# Patient Record
Sex: Female | Born: 1990 | Race: Black or African American | Hispanic: No | Marital: Single | Smoking: Current some day smoker
Health system: Southern US, Community
[De-identification: ages and names within clinical notes are randomized; demographics above are authoritative.]

---
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
## 2010-07-26 ENCOUNTER — Ambulatory Visit: Payer: Self-pay

## 2010-08-07 ENCOUNTER — Ambulatory Visit: Payer: Self-pay

## 2010-08-11 ENCOUNTER — Inpatient Hospital Stay: Payer: Self-pay | Admitting: Obstetrics and Gynecology

## 2010-09-25 ENCOUNTER — Emergency Department: Payer: Self-pay | Admitting: Emergency Medicine

## 2010-12-08 ENCOUNTER — Emergency Department: Payer: Self-pay | Admitting: Internal Medicine

## 2012-06-07 ENCOUNTER — Inpatient Hospital Stay: Payer: Self-pay

## 2012-06-07 LAB — CBC WITH DIFFERENTIAL/PLATELET
Basophil #: 0 10*3/uL (ref 0.0–0.1)
Eosinophil #: 0.1 10*3/uL (ref 0.0–0.7)
Eosinophil %: 0.8 %
HCT: 35.2 % (ref 35.0–47.0)
HGB: 11.8 g/dL — ABNORMAL LOW (ref 12.0–16.0)
Lymphocyte #: 2.2 10*3/uL (ref 1.0–3.6)
MCH: 28.5 pg (ref 26.0–34.0)
MCHC: 33.6 g/dL (ref 32.0–36.0)
Neutrophil #: 7.4 10*3/uL — ABNORMAL HIGH (ref 1.4–6.5)
RBC: 4.13 10*6/uL (ref 3.80–5.20)
RDW: 14.6 % — ABNORMAL HIGH (ref 11.5–14.5)
WBC: 10.4 10*3/uL (ref 3.6–11.0)

## 2012-06-09 LAB — HEMATOCRIT: HCT: 31.3 % — ABNORMAL LOW (ref 35.0–47.0)

## 2012-11-27 ENCOUNTER — Emergency Department: Payer: Self-pay | Admitting: Internal Medicine

## 2012-11-27 LAB — URINALYSIS, COMPLETE
Bilirubin,UR: NEGATIVE
Glucose,UR: NEGATIVE mg/dL (ref 0–75)
Ketone: NEGATIVE
RBC,UR: 2 /HPF (ref 0–5)
Specific Gravity: 1.021 (ref 1.003–1.030)
Squamous Epithelial: 8

## 2012-11-27 LAB — COMPREHENSIVE METABOLIC PANEL
BUN: 15 mg/dL (ref 7–18)
Bilirubin,Total: 1.2 mg/dL — ABNORMAL HIGH (ref 0.2–1.0)
Calcium, Total: 8.8 mg/dL (ref 8.5–10.1)
Co2: 23 mmol/L (ref 21–32)
Creatinine: 0.64 mg/dL (ref 0.60–1.30)
Glucose: 93 mg/dL (ref 65–99)
Osmolality: 272 (ref 275–301)
Potassium: 4.4 mmol/L (ref 3.5–5.1)
SGPT (ALT): 35 U/L (ref 12–78)
Sodium: 136 mmol/L (ref 136–145)
Total Protein: 8.1 g/dL (ref 6.4–8.2)

## 2012-11-27 LAB — PROTIME-INR: INR: 1

## 2012-11-27 LAB — CBC
HGB: 12.9 g/dL (ref 12.0–16.0)
MCH: 28.1 pg (ref 26.0–34.0)
MCHC: 33.3 g/dL (ref 32.0–36.0)
Platelet: 279 10*3/uL (ref 150–440)
RBC: 4.61 10*6/uL (ref 3.80–5.20)
RDW: 13.8 % (ref 11.5–14.5)
WBC: 8.2 10*3/uL (ref 3.6–11.0)

## 2012-12-02 LAB — CULTURE, BLOOD (SINGLE)

## 2013-07-12 ENCOUNTER — Emergency Department: Payer: Self-pay | Admitting: Emergency Medicine

## 2013-07-12 LAB — BASIC METABOLIC PANEL
ANION GAP: 18 — AB (ref 7–16)
BUN: 7 mg/dL (ref 7–18)
Calcium, Total: 8.7 mg/dL (ref 8.5–10.1)
Chloride: 106 mmol/L (ref 98–107)
Co2: 23 mmol/L (ref 21–32)
Creatinine: 0.69 mg/dL (ref 0.60–1.30)
EGFR (African American): >60
EGFR (Non-African Amer.): >60
Glucose: 81 mg/dL (ref 65–99)
OSMOLALITY: 289 (ref 275–301)
POTASSIUM: 3.4 mmol/L — AB (ref 3.5–5.1)
Sodium: 147 mmol/L — ABNORMAL HIGH (ref 136–145)

## 2013-07-12 LAB — CBC
HCT: 39.7 % (ref 35.0–47.0)
HGB: 12.9 g/dL (ref 12.0–16.0)
MCH: 27.6 pg (ref 26.0–34.0)
MCHC: 32.4 g/dL (ref 32.0–36.0)
MCV: 85 fL (ref 80–100)
PLATELETS: 287 10*3/uL (ref 150–440)
RBC: 4.67 10*6/uL (ref 3.80–5.20)
RDW: 13.6 % (ref 11.5–14.5)
WBC: 5.4 10*3/uL (ref 3.6–11.0)

## 2013-07-12 LAB — HCG, QUANTITATIVE, PREGNANCY: BETA HCG, QUANT.: 6335 m[IU]/mL — AB

## 2013-07-12 LAB — URINALYSIS, COMPLETE
BACTERIA: NONE SEEN
Bilirubin,UR: NEGATIVE
GLUCOSE, UR: NEGATIVE mg/dL (ref 0–75)
Ketone: NEGATIVE
Leukocyte Esterase: NEGATIVE
NITRITE: NEGATIVE
Ph: 6 (ref 4.5–8.0)
Protein: 100
RBC,UR: 5941 /HPF (ref 0–5)
Specific Gravity: 1.017 (ref 1.003–1.030)
Squamous Epithelial: 3
WBC UR: 1 /HPF (ref 0–5)

## 2014-01-31 ENCOUNTER — Observation Stay: Payer: Self-pay | Admitting: Obstetrics and Gynecology

## 2014-01-31 LAB — URINALYSIS, COMPLETE
BILIRUBIN, UR: NEGATIVE
BLOOD: NEGATIVE
Glucose,UR: NEGATIVE mg/dL (ref 0–75)
Ketone: NEGATIVE
NITRITE: NEGATIVE
Ph: 5 (ref 4.5–8.0)
Protein: NEGATIVE
Specific Gravity: 1.023 (ref 1.003–1.030)
Squamous Epithelial: 4
WBC UR: 2 /HPF (ref 0–5)

## 2014-01-31 LAB — CBC WITH DIFFERENTIAL/PLATELET
BASOS ABS: 0.1 10*3/uL (ref 0.0–0.1)
Basophil %: 0.8 %
EOS ABS: 0.2 10*3/uL (ref 0.0–0.7)
Eosinophil %: 2.2 %
HCT: 33.7 % — ABNORMAL LOW (ref 35.0–47.0)
HGB: 11.1 g/dL — ABNORMAL LOW (ref 12.0–16.0)
LYMPHS ABS: 2.2 10*3/uL (ref 1.0–3.6)
LYMPHS PCT: 28.9 %
MCH: 28.2 pg (ref 26.0–34.0)
MCHC: 32.8 g/dL (ref 32.0–36.0)
MCV: 86 fL (ref 80–100)
MONO ABS: 0.4 x10 3/mm (ref 0.2–0.9)
Monocyte %: 5.8 %
Neutrophil #: 4.7 10*3/uL (ref 1.4–6.5)
Neutrophil %: 62.3 %
Platelet: 237 10*3/uL (ref 150–440)
RBC: 3.92 10*6/uL (ref 3.80–5.20)
RDW: 14 % (ref 11.5–14.5)
WBC: 7.5 10*3/uL (ref 3.6–11.0)

## 2014-01-31 LAB — COMPREHENSIVE METABOLIC PANEL
ALBUMIN: 3 g/dL — AB (ref 3.4–5.0)
ALK PHOS: 56 U/L
ANION GAP: 8 (ref 7–16)
BUN: 5 mg/dL — AB (ref 7–18)
Bilirubin,Total: 0.4 mg/dL (ref 0.2–1.0)
Calcium, Total: 8.3 mg/dL — ABNORMAL LOW (ref 8.5–10.1)
Chloride: 106 mmol/L (ref 98–107)
Co2: 24 mmol/L (ref 21–32)
Creatinine: 0.54 mg/dL — ABNORMAL LOW (ref 0.60–1.30)
EGFR (African American): >60
Glucose: 139 mg/dL — ABNORMAL HIGH (ref 65–99)
Osmolality: 275 (ref 275–301)
Potassium: 3.5 mmol/L (ref 3.5–5.1)
SGOT(AST): 11 U/L — ABNORMAL LOW (ref 15–37)
SGPT (ALT): 14 U/L
Sodium: 138 mmol/L (ref 136–145)
TOTAL PROTEIN: 6.6 g/dL (ref 6.4–8.2)

## 2014-01-31 LAB — DRUG SCREEN, URINE

## 2014-01-31 LAB — LIPASE, BLOOD: LIPASE: 77 U/L (ref 73–393)

## 2014-02-19 ENCOUNTER — Ambulatory Visit: Payer: Self-pay | Admitting: Advanced Practice Midwife

## 2014-05-20 ENCOUNTER — Observation Stay: Payer: Self-pay | Admitting: Obstetrics and Gynecology

## 2014-05-20 LAB — URINALYSIS, COMPLETE
Bilirubin,UR: NEGATIVE
Blood: NEGATIVE
Glucose,UR: NEGATIVE mg/dL (ref 0–75)
Ketone: NEGATIVE
LEUKOCYTE ESTERASE: NEGATIVE
Nitrite: NEGATIVE
PH: 6 (ref 4.5–8.0)
Protein: 30
RBC,UR: <1 /HPF (ref 0–5)
Specific Gravity: 1.021 (ref 1.003–1.030)
WBC UR: 2 /HPF (ref 0–5)

## 2014-05-21 LAB — RUPTURE OF MEMBRANE PLUS: Rom Plus: NOT DETECTED

## 2014-06-02 ENCOUNTER — Inpatient Hospital Stay: Payer: Medicaid Other | Admitting: Obstetrics and Gynecology

## 2014-06-02 LAB — CBC WITH DIFFERENTIAL/PLATELET
Basophil #: 0 10*3/uL (ref 0.0–0.1)
Basophil %: 0.2 %
Eosinophil #: 0 10*3/uL (ref 0.0–0.7)
Eosinophil %: 0.6 %
HCT: 34.5 % — AB (ref 35.0–47.0)
HGB: 11.3 g/dL — ABNORMAL LOW (ref 12.0–16.0)
Lymphocyte #: 1.9 10*3/uL (ref 1.0–3.6)
Lymphocyte %: 22.2 %
MCH: 27.5 pg (ref 26.0–34.0)
MCHC: 32.7 g/dL (ref 32.0–36.0)
MCV: 84 fL (ref 80–100)
MONO ABS: 0.7 x10 3/mm (ref 0.2–0.9)
MONOS PCT: 8.6 %
NEUTROS ABS: 5.9 10*3/uL (ref 1.4–6.5)
Neutrophil %: 68.4 %
Platelet: 228 10*3/uL (ref 150–440)
RBC: 4.09 10*6/uL (ref 3.80–5.20)
RDW: 16.8 % — AB (ref 11.5–14.5)
WBC: 8.6 10*3/uL (ref 3.6–11.0)

## 2014-06-03 LAB — HEMATOCRIT: HCT: 32.3 % — ABNORMAL LOW (ref 35.0–47.0)

## 2014-06-03 LAB — HEMOGLOBIN: HGB: 10.6 g/dL — ABNORMAL LOW (ref 12.0–16.0)

## 2014-06-03 LAB — GC/CHLAMYDIA PROBE AMP

## 2014-08-07 ENCOUNTER — Emergency Department
Admit: 2014-08-07 | Disposition: A | Discharge status: Home / Self Care | Payer: Self-pay | Admitting: Emergency Medicine

## 2014-08-07 LAB — COMPREHENSIVE METABOLIC PANEL
ALK PHOS: 64 U/L
ANION GAP: 6 — AB (ref 7–16)
AST: 18 U/L
Albumin: 4.4 g/dL
BUN: 10 mg/dL
Bilirubin,Total: 0.8 mg/dL
CALCIUM: 9.3 mg/dL
CHLORIDE: 110 mmol/L
Co2: 23 mmol/L
Creatinine: 0.67 mg/dL
EGFR (Non-African Amer.): >60
Glucose: 98 mg/dL
POTASSIUM: 3.7 mmol/L
SGPT (ALT): 18 U/L
Sodium: 139 mmol/L
Total Protein: 7.8 g/dL

## 2014-08-07 LAB — URINALYSIS, COMPLETE
BILIRUBIN, UR: NEGATIVE
Glucose,UR: NEGATIVE mg/dL (ref 0–75)
KETONE: NEGATIVE
LEUKOCYTE ESTERASE: NEGATIVE
Nitrite: NEGATIVE
PH: 5 (ref 4.5–8.0)
Protein: NEGATIVE
RBC,UR: 13 /HPF (ref 0–5)
SPECIFIC GRAVITY: 1.014 (ref 1.003–1.030)
WBC UR: 3 /HPF (ref 0–5)

## 2014-08-07 LAB — CBC
HCT: 38.3 % (ref 35.0–47.0)
HGB: 12.4 g/dL (ref 12.0–16.0)
MCH: 27.3 pg (ref 26.0–34.0)
MCHC: 32.2 g/dL (ref 32.0–36.0)
MCV: 85 fL (ref 80–100)
Platelet: 303 10*3/uL (ref 150–440)
RBC: 4.53 10*6/uL (ref 3.80–5.20)
RDW: 14.5 % (ref 11.5–14.5)
WBC: 6.1 10*3/uL (ref 3.6–11.0)

## 2014-08-07 LAB — WET PREP, GENITAL

## 2014-08-07 LAB — GC/CHLAMYDIA PROBE AMP

## 2014-09-15 NOTE — H&P (Signed)
L&D Evaluation:  History:  HPI 24 yo [id number] at 5553w0d [facility] by [facility] today presenting for nausea and emesis.  Her last emesis was 14:00 she kept down some chicken since then.  She was supposed to work this evening.  Denies fevers, chills, abdominal pain, sick contacts.  Has kept down some grape juice here after presentation and has been sleeping comfortably.  +FM, no LOF, no VB, no ctx  No prenatal care to date.  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  HEENT, CNS, GI, GU, Respiratory, CV, Renal and Musculoskeletal systems were found to be normal., unless noted in HPI   Exam:  Vital Signs stable   General no apparent distress   Mental Status clear   Chest clear   Heart normal sinus rhythm   Abdomen gravid, non-tender   Back no CVAT   Edema no edema   Mebranes Ruptured   FHT +FHT on dating scan   Ucx absent   Skin keloid along left mandibular angle about 4cm toward mentum   Impression:  Impression 24 yo [id number]   Plan:  Plan EFM/NST, monitor BP   Comments 1) Nausea - normal CBC, CMP, and lipase, and UA - no ketones on UA, lytes normal, and BG 139 (did have to have 3-hr OGTT done last pregnancy) - Rx zofran  2) Fetus - previable but FHT and dating confirmed today on scan  3) Disposition - discharge home, patient to arrange follow in process of arraning follow up at [facility]   Follow Up Appointment need to schedule   Electronic Signatures: Lorrene ReidStaebler, Khaila Velarde M (MD)  (Signed 26-Sep-15  14:35)  Authored: L&D Evaluation   Last Updated: 26-Sep-15 14:35 by Lorrene ReidStaebler, Ayona Yniguez M (MD)

## 2014-09-15 NOTE — H&P (Signed)
L&D Evaluation:  History:   HPI 24 yo G4P3003 at 6235w4d gestational age by 35 week ultrasound.  Pregnancy complicated by late entry to prenatal care, history of depression with psychosis with current hallucinations (visual and auditory) since age 24.  Prenatal care at [facility].  Presented with SROM at 5am this morning of clear fluid. Notes positive fetal movement, denies vaginal bleeding, notes occasional contractions.   O+, RPR NR, RI, HBsAg neg, GBS +, failed 1 hour, passed 3 hour.    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  HEENT, CNS, GI, GU, Respiratory, CV, Renal and Musculoskeletal systems were found to be normal., unless noted in HPI   Exam:   Vital Signs stable    General no apparent distress    Mental Status clear    Chest clear    Heart normal sinus rhythm    Abdomen gravid, non-tender    Estimated Fetal Weight 7 pounds    Fetal Position cephalic    Back no CVAT    Edema no edema    Pelvic no external lesions, 1cm per RN    Mebranes Ruptured    Description light brown/yellow    FHT normal rate with no decels    FHT Description 140/mod var/+accels/no decels    Ucx irregular, 2-3 contractions q 10 minutes avg over past 30 min    Skin keloid along left mandibular angle about 4cm toward mentum   Impression:   Impression PROM   Plan:   Plan EFM/NST, monitor BP, antibiotics for GBBS prophylaxis    Comments - start pitocin (already started) - actively manage labor - admit for PROM - desires BTL (papers signed 05/21/12) - late entry to care -> needs SW counselor   Labs:  Lab Results: Routine Hem:  31-Jan-14 09:55    WBC (CBC) 10.4   RBC (CBC) 4.13   Hemoglobin (CBC)  11.8   Hematocrit (CBC) 35.2    Platelet Count (CBC) 291   MCV 85   MCH 28.5   MCHC 33.6   RDW  14.6   Neutrophil % 71.3   Lymphocyte % 20.9   Monocyte % 6.6   Eosinophil % 0.8   Basophil % 0.4   Neutrophil #  7.4   Lymphocyte # 2.2   Monocyte # 0.7   Eosinophil # 0.1   Basophil # 0.0 (Result(s) reported on 07 Jun 2012 at 10:20AM.)   Electronic Signatures: Conard NovakJackson, Daylene Vandenbosch D (MD)  (Signed 31-Jan-14 14:57)  Authored: L&D Evaluation, Labs   Last Updated: 31-Jan-14 14:57 by Conard NovakJackson, Cherokee Boccio D (MD)

## 2014-10-09 ENCOUNTER — Encounter: Payer: Self-pay | Admitting: Emergency Medicine

## 2014-10-09 ENCOUNTER — Emergency Department
Admission: EM | Admit: 2014-10-09 | Discharge: 2014-10-09 | Disposition: A | Discharge status: Home / Self Care | Payer: Medicaid Other | Attending: Emergency Medicine | Admitting: Emergency Medicine

## 2014-10-09 DIAGNOSIS — B349 Viral infection, unspecified: Secondary | ICD-10-CM | POA: Diagnosis not present

## 2014-10-09 DIAGNOSIS — Z3202 Encounter for pregnancy test, result negative: Secondary | ICD-10-CM | POA: Insufficient documentation

## 2014-10-09 DIAGNOSIS — J029 Acute pharyngitis, unspecified: Secondary | ICD-10-CM | POA: Diagnosis present

## 2014-10-09 DIAGNOSIS — J069 Acute upper respiratory infection, unspecified: Secondary | ICD-10-CM | POA: Diagnosis not present

## 2014-10-09 DIAGNOSIS — Z720 Tobacco use: Secondary | ICD-10-CM | POA: Diagnosis not present

## 2014-10-09 LAB — BASIC METABOLIC PANEL
Anion gap: 11 (ref 5–15)
BUN: 8 mg/dL (ref 6–20)
CALCIUM: 9.4 mg/dL (ref 8.9–10.3)
CO2: 24 mmol/L (ref 22–32)
Chloride: 104 mmol/L (ref 101–111)
Creatinine, Ser: 0.74 mg/dL (ref 0.44–1.00)
GFR calc Af Amer: >60 mL/min (ref >60)
GLUCOSE: 86 mg/dL (ref 65–99)
POTASSIUM: 3.8 mmol/L (ref 3.5–5.1)
Sodium: 139 mmol/L (ref 135–145)

## 2014-10-09 LAB — CBC WITH DIFFERENTIAL/PLATELET
Basophils Absolute: 0 10*3/uL (ref 0–0.1)
Basophils Relative: 1 %
EOS PCT: 3 %
Eosinophils Absolute: 0.2 10*3/uL (ref 0–0.7)
HEMATOCRIT: 40.7 % (ref 35.0–47.0)
Hemoglobin: 13.4 g/dL (ref 12.0–16.0)
LYMPHS PCT: 20 %
Lymphs Abs: 1.3 10*3/uL (ref 1.0–3.6)
MCH: 28 pg (ref 26.0–34.0)
MCHC: 33 g/dL (ref 32.0–36.0)
MCV: 85 fL (ref 80.0–100.0)
Monocytes Absolute: 0.8 10*3/uL (ref 0.2–0.9)
Monocytes Relative: 12 %
NEUTROS PCT: 64 %
Neutro Abs: 4.5 10*3/uL (ref 1.4–6.5)
Platelets: 284 10*3/uL (ref 150–440)
RBC: 4.78 MIL/uL (ref 3.80–5.20)
RDW: 13.2 % (ref 11.5–14.5)
WBC: 6.8 10*3/uL (ref 3.6–11.0)

## 2014-10-09 LAB — URINALYSIS COMPLETE WITH MICROSCOPIC (ARMC ONLY)
BACTERIA UA: NONE SEEN
Bilirubin Urine: NEGATIVE
Glucose, UA: NEGATIVE mg/dL
HGB URINE DIPSTICK: NEGATIVE
KETONES UR: NEGATIVE mg/dL
LEUKOCYTES UA: NEGATIVE
Nitrite: NEGATIVE
Protein, ur: NEGATIVE mg/dL
RBC / HPF: NONE SEEN RBC/hpf (ref 0–5)
SPECIFIC GRAVITY, URINE: 1.025 (ref 1.005–1.030)
pH: 5 (ref 5.0–8.0)

## 2014-10-09 LAB — POCT RAPID STREP A: Streptococcus, Group A Screen (Direct): NEGATIVE

## 2014-10-09 LAB — POCT PREGNANCY, URINE: Preg Test, Ur: NEGATIVE

## 2014-10-09 MED ORDER — IBUPROFEN 800 MG PO TABS: 800.0000 mg | ORAL_TABLET | Freq: Three times a day (TID) | ORAL | Status: DC | PRN

## 2014-10-09 MED ORDER — SODIUM CHLORIDE 0.9 % IV BOLUS (SEPSIS)
500.0000 mL | Freq: Once | INTRAVENOUS | Status: AC
  Administered 2014-10-09: 500 mL via INTRAVENOUS

## 2014-10-09 MED ORDER — KETOROLAC TROMETHAMINE 30 MG/ML IJ SOLN
INTRAMUSCULAR | Status: AC
  Administered 2014-10-09: 30 mg via INTRAVENOUS
  Filled 2014-10-09: qty 1

## 2014-10-09 MED ORDER — LORATADINE-PSEUDOEPHEDRINE ER 5-120 MG PO TB12: 1.0000 | ORAL_TABLET | Freq: Two times a day (BID) | ORAL | Status: AC

## 2014-10-09 MED ORDER — DEXAMETHASONE SODIUM PHOSPHATE 10 MG/ML IJ SOLN
INTRAMUSCULAR | Status: AC
  Administered 2014-10-09: 10 mg via INTRAVENOUS
  Filled 2014-10-09: qty 1

## 2014-10-09 MED ORDER — KETOROLAC TROMETHAMINE 30 MG/ML IJ SOLN
30.0000 mg | Freq: Once | INTRAMUSCULAR | Status: AC
  Administered 2014-10-09: 30 mg via INTRAVENOUS

## 2014-10-09 MED ORDER — TRAMADOL HCL 50 MG PO TABS: 50.0000 mg | ORAL_TABLET | Freq: Four times a day (QID) | ORAL | Status: DC | PRN

## 2014-10-09 MED ORDER — DEXAMETHASONE SODIUM PHOSPHATE 10 MG/ML IJ SOLN
10.0000 mg | Freq: Once | INTRAMUSCULAR | Status: AC
Start: 2014-10-09 — End: 2014-10-09
  Administered 2014-10-09: 10 mg via INTRAVENOUS

## 2014-10-09 NOTE — ED Notes (Signed)
Patient presents to the ED with sore throat since yesterday, headache, congestion and body aches.

## 2014-10-09 NOTE — ED Notes (Signed)
Pt states that she has been feeling poorly since yesterday. Pt states that she hurts "all over - head, throat, legs, back, everywhere." Pt is moaning and moving around in the bed during assessment. Pt states she has also had sneezing and nasal congestion as well as cough (clear phlegm) since yesterday.

## 2014-10-09 NOTE — ED Provider Notes (Signed)
CSN: [id number]     Arrival date & time 10/09/14  1832 History   First MD Initiated Contact with Patient 10/09/14 2051     Chief Complaint  Patient presents with  . Sore Throat  . Headache     (Consider location/radiation/quality/duration/timing/severity/associated sxs/prior Treatment) HPI Patient has a 1 day history of feeling terrible states that she started hurting all over her head throat legs back that she has a cough drainage sore throat and just cannot seem to feel good tried some over-the-counter medication at home but got to the point where she was feeling bad enough that she came and she's had a subjective fever chills denies any nausea and is here today for further evaluation and treatment nothing seemingly making her better or worse History reviewed. No pertinent past medical history. History reviewed. No pertinent past surgical history. No family history on file. History  Substance Use Topics  . Smoking status: Current Every Day Smoker  . Smokeless tobacco: Not on file  . Alcohol Use: Not on file   OB History    No data available     Review of Systems  Constitutional: Negative.   Eyes: Negative.   Cardiovascular: Negative.   Gastrointestinal: Negative.   Genitourinary: Negative.   Skin: Negative.   Allergic/Immunologic: Negative.   Neurological: Negative.   Hematological: Negative.   All other systems reviewed and are negative.     Allergies  Review of patient's allergies indicates no known allergies.  Home Medications   Prior to Admission medications   Medication Sig Start Date End Date Taking? Authorizing Provider  ibuprofen (ADVIL,MOTRIN) 800 MG tablet Take 1 tablet (800 mg total) by mouth every 8 (eight) hours as needed. 10/09/14   Luvern Mischke William C Eleri Ruben, PA-C  loratadine-pseudoephedrine (CLARITIN-D 12 HOUR) 5-120 MG per tablet Take 1 tablet by mouth 2 (two) times daily. 10/09/14 10/09/15  Rayane Gallardo William C Fed Ceci, PA-C  traMADol (ULTRAM) 50 MG tablet Take 1  tablet (50 mg total) by mouth every 6 (six) hours as needed. 10/09/14   Kacie Huxtable William C Rocket Gunderson, PA-C   BP 106/72 mmHg  Pulse 97  Temp(Src) 98.6 F (37 C) (Oral)  Resp 18  Ht 5\' 6"  (1.676 m)  Wt 205 lb (92.987 kg)  BMI 33.10 kg/m2  SpO2 98% Physical Exam Female appearing stated age well-developed well-nourished no acute distress vitals were reviewed Head ears eyes nose neck and throat exam revealed nasal drainage postnasal drip Cardiovascular regular rate and rhythm no murmurs rubs gallops Pulmonary lungs clear to auscultation bilaterally Abdomen soft flat nontender bowel sounds positive in all 4 quadrants Skin was free of rash or disease Extremities show no cyanosis clubbing or edema musculoskeletal moving all extremities without difficulty Neuro exam nonfocal cranial nerves II through XII grossly intact ED Course  Procedures (including critical care time) Labs Review Labs Reviewed  URINALYSIS COMPLETEWITH MICROSCOPIC ([facility] ONLY) - Abnormal; Notable for the following:    Color, Urine YELLOW (*)    APPearance CLEAR (*)    Squamous Epithelial / LPF 0-5 (*)    All other components within normal limits  CULTURE, GROUP A STREP ([facility] ONLY)  CBC WITH DIFFERENTIAL/PLATELET  BASIC METABOLIC PANEL  POC URINE PREG, ED  POCT PREGNANCY, URINE  POCT RAPID STREP A     MDM  The patient who had an otherwise negative workup she was given IV fluids Toradol and dexamethasone with great improvement feels much better she is tolerating food and fluids orally and a discharge her home  with NSAIDs decongest and encourage plenty of fluids follow-up with her doctor or return to the ER as needed return here for any acute concerns or worsening symptoms otherwise  Final diagnoses:  Viral syndrome  Acute URI       Vivian Okelley Rosalyn Gess, PA-C 10/09/14 2333  Minna Antis, MD 10/11/14 [phone]

## 2014-10-12 LAB — CULTURE, GROUP A STREP (THRC)

## 2015-02-02 ENCOUNTER — Encounter: Payer: Self-pay | Admitting: Emergency Medicine

## 2015-02-02 ENCOUNTER — Emergency Department
Admission: EM | Admit: 2015-02-02 | Discharge: 2015-02-02 | Disposition: A | Discharge status: Home / Self Care | Payer: Medicaid Other | Attending: Emergency Medicine | Admitting: Emergency Medicine

## 2015-02-02 ENCOUNTER — Emergency Department: Payer: Medicaid Other

## 2015-02-02 DIAGNOSIS — Z720 Tobacco use: Secondary | ICD-10-CM | POA: Insufficient documentation

## 2015-02-02 DIAGNOSIS — N200 Calculus of kidney: Secondary | ICD-10-CM | POA: Diagnosis not present

## 2015-02-02 DIAGNOSIS — Z3202 Encounter for pregnancy test, result negative: Secondary | ICD-10-CM | POA: Insufficient documentation

## 2015-02-02 DIAGNOSIS — Z79899 Other long term (current) drug therapy: Secondary | ICD-10-CM | POA: Insufficient documentation

## 2015-02-02 DIAGNOSIS — R109 Unspecified abdominal pain: Secondary | ICD-10-CM | POA: Diagnosis present

## 2015-02-02 LAB — URINALYSIS COMPLETE WITH MICROSCOPIC (ARMC ONLY)
BILIRUBIN URINE: NEGATIVE
Glucose, UA: NEGATIVE mg/dL
Ketones, ur: NEGATIVE mg/dL
Nitrite: NEGATIVE
PROTEIN: 30 mg/dL — AB
Specific Gravity, Urine: 1.01 (ref 1.005–1.030)
pH: 5 (ref 5.0–8.0)

## 2015-02-02 LAB — BASIC METABOLIC PANEL
ANION GAP: 8 (ref 5–15)
BUN: 10 mg/dL (ref 6–20)
CALCIUM: 9 mg/dL (ref 8.9–10.3)
CHLORIDE: 105 mmol/L (ref 101–111)
CO2: 22 mmol/L (ref 22–32)
Creatinine, Ser: 0.8 mg/dL (ref 0.44–1.00)
GFR calc non Af Amer: >60 mL/min (ref >60)
GLUCOSE: 96 mg/dL (ref 65–99)
Potassium: 3.9 mmol/L (ref 3.5–5.1)
Sodium: 135 mmol/L (ref 135–145)

## 2015-02-02 LAB — CBC
HEMATOCRIT: 39.2 % (ref 35.0–47.0)
HEMOGLOBIN: 13.1 g/dL (ref 12.0–16.0)
MCH: 28.4 pg (ref 26.0–34.0)
MCHC: 33.4 g/dL (ref 32.0–36.0)
MCV: 85 fL (ref 80.0–100.0)
Platelets: 316 10*3/uL (ref 150–440)
RBC: 4.62 MIL/uL (ref 3.80–5.20)
RDW: 13.4 % (ref 11.5–14.5)
WBC: 10.6 10*3/uL (ref 3.6–11.0)

## 2015-02-02 LAB — POCT PREGNANCY, URINE: Preg Test, Ur: NEGATIVE

## 2015-02-02 MED ORDER — HYDROMORPHONE HCL 1 MG/ML IJ SOLN
INTRAMUSCULAR | Status: AC
  Administered 2015-02-02: 1 mg via INTRAVENOUS
  Filled 2015-02-02: qty 1

## 2015-02-02 MED ORDER — TAMSULOSIN HCL 0.4 MG PO CAPS: 0.4000 mg | ORAL_CAPSULE | Freq: Every day | ORAL | Status: DC

## 2015-02-02 MED ORDER — ONDANSETRON HCL 4 MG PO TABS: 4.0000 mg | ORAL_TABLET | Freq: Every day | ORAL | Status: DC | PRN

## 2015-02-02 MED ORDER — CEPHALEXIN 500 MG PO CAPS: 500.0000 mg | ORAL_CAPSULE | Freq: Three times a day (TID) | ORAL | Status: DC

## 2015-02-02 MED ORDER — ONDANSETRON HCL 4 MG/2ML IJ SOLN
INTRAMUSCULAR | Status: AC
  Administered 2015-02-02: 4 mg via INTRAVENOUS
  Filled 2015-02-02: qty 2

## 2015-02-02 MED ORDER — MORPHINE SULFATE (PF) 4 MG/ML IV SOLN
4.0000 mg | Freq: Once | INTRAVENOUS | Status: AC
  Administered 2015-02-02: 4 mg via INTRAVENOUS

## 2015-02-02 MED ORDER — OXYCODONE-ACETAMINOPHEN 5-325 MG PO TABS: 1.0000 | ORAL_TABLET | ORAL | Status: DC | PRN

## 2015-02-02 MED ORDER — KETOROLAC TROMETHAMINE 30 MG/ML IJ SOLN
30.0000 mg | Freq: Once | INTRAMUSCULAR | Status: AC
  Administered 2015-02-02: 30 mg via INTRAVENOUS

## 2015-02-02 MED ORDER — SODIUM CHLORIDE 0.9 % IV BOLUS (SEPSIS)
1000.0000 mL | Freq: Once | INTRAVENOUS | Status: AC
  Administered 2015-02-02: 1000 mL via INTRAVENOUS

## 2015-02-02 MED ORDER — ONDANSETRON HCL 4 MG/2ML IJ SOLN
4.0000 mg | Freq: Once | INTRAMUSCULAR | Status: AC
  Administered 2015-02-02: 4 mg via INTRAVENOUS

## 2015-02-02 MED ORDER — MORPHINE SULFATE (PF) 4 MG/ML IV SOLN
INTRAVENOUS | Status: AC
  Administered 2015-02-02: 4 mg via INTRAVENOUS
  Filled 2015-02-02: qty 1

## 2015-02-02 MED ORDER — IBUPROFEN 800 MG PO TABS: 800.0000 mg | ORAL_TABLET | Freq: Three times a day (TID) | ORAL | Status: DC | PRN

## 2015-02-02 MED ORDER — HYDROMORPHONE HCL 1 MG/ML IJ SOLN
1.0000 mg | Freq: Once | INTRAMUSCULAR | Status: AC
Start: 2015-02-02 — End: 2015-02-02
  Administered 2015-02-02: 1 mg via INTRAVENOUS

## 2015-02-02 MED ORDER — KETOROLAC TROMETHAMINE 30 MG/ML IJ SOLN
INTRAMUSCULAR | Status: AC
  Filled 2015-02-02: qty 1

## 2015-02-02 NOTE — ED Provider Notes (Signed)
[facility] Emergency Department Provider Note   ____________________________________________  Time seen: 31  I have reviewed the triage vital signs and the nursing notes.   HISTORY  Chief Complaint Flank Pain   History limited by: Not Limited   HPI Michelle Deleon is a 24 y.o. female who presents to the emergency department today because of left-sided flank pain. The patient states that the pain started yesterday and is been constant. She states is gradually gotten worse. She describes it is sharp and severe. It's in her left flank and radiates down into her left groin. She did have some associated dysuria. She denies any blood in her urine. She denies any fevers. Denies any vomiting. Denies any similar pain in the past.    History reviewed. No pertinent past medical history.  There are no active problems to display for this patient.   History reviewed. No pertinent past surgical history.  Current Outpatient Rx  Name  Route  Sig  Dispense  Refill  . ibuprofen (ADVIL,MOTRIN) 800 MG tablet   Oral   Take 1 tablet (800 mg total) by mouth every 8 (eight) hours as needed.   30 tablet   0   . loratadine-pseudoephedrine (CLARITIN-D 12 HOUR) 5-120 MG per tablet   Oral   Take 1 tablet by mouth 2 (two) times daily.   20 tablet   0   . traMADol (ULTRAM) 50 MG tablet   Oral   Take 1 tablet (50 mg total) by mouth every 6 (six) hours as needed.   12 tablet   0     Allergies Review of patient's allergies indicates no known allergies.  No family history on file.  Social History Social History  Substance Use Topics  . Smoking status: Current Every Day Smoker -- 1.00 packs/day    Types: Cigarettes  . Smokeless tobacco: None  . Alcohol Use: No    Review of Systems  Constitutional: Negative for fever. Cardiovascular: Negative for chest pain. Respiratory: Negative for shortness of breath. Gastrointestinal: left flank pain Genitourinary:  positive for dysuria. Musculoskeletal: Negative for back pain. Skin: Negative for rash. Neurological: Negative for headaches, focal weakness or numbness.   10-point ROS otherwise negative.  ____________________________________________   PHYSICAL EXAM:  VITAL SIGNS: ED Triage Vitals  Enc Vitals Group     BP 02/02/15 1914 127/56 mmHg     Pulse Rate 02/02/15 1914 81     Resp 02/02/15 1914 22     Temp 02/02/15 1914 98.2 F (36.8 C)     Temp Source 02/02/15 1914 Oral     SpO2 02/02/15 1914 95 %     Weight 02/02/15 1914 180 lb (81.647 kg)     Height 02/02/15 1914  (1.676 m)     Head Cir --      Peak Flow --      Pain Score 02/02/15 1914 10   Constitutional: Alert and oriented. Well appearing and in no distress. Eyes: Conjunctivae are normal. PERRL. Normal extraocular movements. ENT   Head: Normocephalic and atraumatic.   Nose: No congestion/rhinnorhea.   Mouth/Throat: Mucous membranes are moist.   Neck: No stridor. Hematological/Lymphatic/Immunilogical: No cervical lymphadenopathy. Cardiovascular: Normal rate, regular rhythm.  No murmurs, rubs, or gallops. Respiratory: Normal respiratory effort without tachypnea nor retractions. Breath sounds are clear and equal bilaterally. No wheezes/rales/rhonchi. Gastrointestinal: Soft and nontender. No distention. There is no CVA tenderness. Genitourinary: Deferred Musculoskeletal: Normal range of motion in all extremities. No joint effusions.  No  lower extremity tenderness nor edema. Neurologic:  Normal speech and language. No gross focal neurologic deficits are appreciated. Speech is normal.  Skin:  Skin is warm, dry and intact. No rash noted. Psychiatric: Mood and affect are normal. Speech and behavior are normal. Patient exhibits appropriate insight and judgment.  ____________________________________________    LABS (pertinent positives/negatives)  Labs Reviewed  URINALYSIS COMPLETEWITH MICROSCOPIC ([facility] ONLY)  - Abnormal; Notable for the following:    Color, Urine YELLOW (*)    APPearance CLOUDY (*)    Hgb urine dipstick 2+ (*)    Protein, ur 30 (*)    Leukocytes, UA 3+ (*)    Bacteria, UA RARE (*)    Squamous Epithelial / LPF 0-5 (*)    All other components within normal limits  BASIC METABOLIC PANEL  CBC  POC URINE PREG, ED  POCT PREGNANCY, URINE     ____________________________________________   EKG  None  ____________________________________________    RADIOLOGY  Ultrasound renal IMPRESSION: Mild left hydronephrosis.    ____________________________________________   PROCEDURES  Procedure(s) performed: None  Critical Care performed: No  ____________________________________________   INITIAL IMPRESSION / ASSESSMENT AND PLAN / ED COURSE  Pertinent labs & imaging results that were available during my care of the patient were reviewed by me and considered in my medical decision making (see chart for details).  Patient presented to the emergency department today with left flank pain radiating into her groin. Her clinical story and patient were concerning for a kidney stone. Urine did have large number of white blood cells and red blood cells. No leukocytosis and the blood work. Ultrasound shows mild left hydronephrosis. The patient's pain controlled was achieved through Toradol. Will plan on discharging patient home with Flomax analgesics, antiemetics as well as antibiotic giving white blood cells and white blood cell clumps in the urine.  ____________________________________________   FINAL CLINICAL IMPRESSION(S) / ED DIAGNOSES  Final diagnoses:  Kidney stone     Phineas Semen, MD 02/02/15 2251

## 2015-02-02 NOTE — ED Notes (Signed)
Pt ambulatory to and from restroom without incident.

## 2015-02-02 NOTE — Discharge Instructions (Signed)
Please seek medical attention for any high fevers, chest pain, shortness of breath, change in behavior, persistent vomiting, bloody stool or any other new or concerning symptoms. ° ° °Kidney Stones °Kidney stones (urolithiasis) are deposits that form inside your kidneys. The intense pain is caused by the stone moving through the urinary tract. When the stone moves, the ureter goes into spasm around the stone. The stone is usually passed in the urine.  °CAUSES  °· A disorder that makes certain neck glands produce too much parathyroid hormone (primary hyperparathyroidism). °· A buildup of uric acid crystals, similar to gout in your joints. °· Narrowing (stricture) of the ureter. °· A kidney obstruction present at birth (congenital obstruction). °· Previous surgery on the kidney or ureters. °· Numerous kidney infections. °SYMPTOMS  °· Feeling sick to your stomach (nauseous). °· Throwing up (vomiting). °· Blood in the urine (hematuria). °· Pain that usually spreads (radiates) to the groin. °· Frequency or urgency of urination. °DIAGNOSIS  °· Taking a history and physical exam. °· Blood or urine tests. °· CT scan. °· Occasionally, an examination of the inside of the urinary bladder (cystoscopy) is performed. °TREATMENT  °· Observation. °· Increasing your fluid intake. °· Extracorporeal shock wave lithotripsy--This is a noninvasive procedure that uses shock waves to break up kidney stones. °· Surgery may be needed if you have severe pain or persistent obstruction. There are various surgical procedures. Most of the procedures are performed with the use of small instruments. Only small incisions are needed to accommodate these instruments, so recovery time is minimized. °The size, location, and chemical composition are all important variables that will determine the proper choice of action for you. Talk to your health care provider to better understand your situation so that you will minimize the risk of injury to yourself  and your kidney.  °HOME CARE INSTRUCTIONS  °· Drink enough water and fluids to keep your urine clear or pale yellow. This will help you to pass the stone or stone fragments. °· Strain all urine through the provided strainer. Keep all particulate matter and stones for your health care provider to see. The stone causing the pain may be as small as a grain of salt. It is very important to use the strainer each and every time you pass your urine. The collection of your stone will allow your health care provider to analyze it and verify that a stone has actually passed. The stone analysis will often identify what you can do to reduce the incidence of recurrences. °· Only take over-the-counter or prescription medicines for pain, discomfort, or fever as directed by your health care provider. °· Make a follow-up appointment with your health care provider as directed. °· Get follow-up X-rays if required. The absence of pain does not always mean that the stone has passed. It may have only stopped moving. If the urine remains completely obstructed, it can cause loss of kidney function or even complete destruction of the kidney. It is your responsibility to make sure X-rays and follow-ups are completed. Ultrasounds of the kidney can show blockages and the status of the kidney. Ultrasounds are not associated with any radiation and can be performed easily in a matter of minutes. °SEEK MEDICAL CARE IF: °· You experience pain that is progressive and unresponsive to any pain medicine you have been prescribed. °SEEK IMMEDIATE MEDICAL CARE IF:  °· Pain cannot be controlled with the prescribed medicine. °· You have a fever or shaking chills. °· The severity or intensity   of pain increases over 18 hours and is not relieved by pain medicine. °· You develop a new onset of abdominal pain. °· You feel faint or pass out. °· You are unable to urinate. °MAKE SURE YOU:  °· Understand these instructions. °· Will watch your condition. °· Will get  help right away if you are not doing well or get worse. °Document Released: 04/24/2005 Document Revised: 12/25/2012 Document Reviewed: 09/25/2012 °[employer]® Patient Information ©2015 [employer]. This information is not intended to replace advice given to you by your health care provider. Make sure you discuss any questions you have with your health care provider. ° °

## 2015-02-02 NOTE — ED Notes (Signed)
MD at bedside for eval.

## 2015-02-02 NOTE — ED Notes (Signed)
Pt to triage via w/c, appears uncomfortable, grimacing; c/o left flank pain since yesterday radiating to left lower abd accomp by dysuria; denies hx of same

## 2015-02-02 NOTE — ED Notes (Signed)
Patient transported to [facility] 

## 2015-09-25 ENCOUNTER — Emergency Department: Payer: Medicaid Other

## 2015-09-25 ENCOUNTER — Emergency Department
Admission: EM | Admit: 2015-09-25 | Discharge: 2015-09-25 | Disposition: A | Discharge status: Home / Self Care | Payer: Medicaid Other | Attending: Student | Admitting: Student

## 2015-09-25 DIAGNOSIS — F17210 Nicotine dependence, cigarettes, uncomplicated: Secondary | ICD-10-CM | POA: Diagnosis not present

## 2015-09-25 DIAGNOSIS — R109 Unspecified abdominal pain: Secondary | ICD-10-CM | POA: Diagnosis present

## 2015-09-25 DIAGNOSIS — Z79899 Other long term (current) drug therapy: Secondary | ICD-10-CM | POA: Insufficient documentation

## 2015-09-25 DIAGNOSIS — N390 Urinary tract infection, site not specified: Secondary | ICD-10-CM | POA: Insufficient documentation

## 2015-09-25 LAB — URINALYSIS COMPLETE WITH MICROSCOPIC (ARMC ONLY)
Bilirubin Urine: NEGATIVE
Glucose, UA: NEGATIVE mg/dL
KETONES UR: NEGATIVE mg/dL
Nitrite: NEGATIVE
PH: 6 (ref 5.0–8.0)
PROTEIN: 100 mg/dL — AB
SPECIFIC GRAVITY, URINE: 1.014 (ref 1.005–1.030)

## 2015-09-25 LAB — CBC
HCT: 38.7 % (ref 35.0–47.0)
Hemoglobin: 13 g/dL (ref 12.0–16.0)
MCH: 27.8 pg (ref 26.0–34.0)
MCHC: 33.5 g/dL (ref 32.0–36.0)
MCV: 83 fL (ref 80.0–100.0)
PLATELETS: 300 10*3/uL (ref 150–440)
RBC: 4.66 MIL/uL (ref 3.80–5.20)
RDW: 13.1 % (ref 11.5–14.5)
WBC: 10 10*3/uL (ref 3.6–11.0)

## 2015-09-25 LAB — POCT PREGNANCY, URINE: PREG TEST UR: NEGATIVE

## 2015-09-25 LAB — BASIC METABOLIC PANEL
ANION GAP: 7 (ref 5–15)
BUN: 10 mg/dL (ref 6–20)
CALCIUM: 9.2 mg/dL (ref 8.9–10.3)
CO2: 21 mmol/L — AB (ref 22–32)
CREATININE: 0.76 mg/dL (ref 0.44–1.00)
Chloride: 111 mmol/L (ref 101–111)
Glucose, Bld: 99 mg/dL (ref 65–99)
Potassium: 3.8 mmol/L (ref 3.5–5.1)
SODIUM: 139 mmol/L (ref 135–145)

## 2015-09-25 MED ORDER — MORPHINE SULFATE (PF) 4 MG/ML IV SOLN
INTRAVENOUS | Status: AC
  Filled 2015-09-25: qty 1

## 2015-09-25 MED ORDER — HYDROMORPHONE HCL 1 MG/ML IJ SOLN
1.0000 mg | Freq: Once | INTRAMUSCULAR | Status: AC
  Administered 2015-09-25: 1 mg via INTRAVENOUS
  Filled 2015-09-25: qty 1

## 2015-09-25 MED ORDER — CEPHALEXIN 500 MG PO CAPS
500.0000 mg | ORAL_CAPSULE | Freq: Once | ORAL | Status: AC
  Administered 2015-09-25: 500 mg via ORAL
  Filled 2015-09-25: qty 1

## 2015-09-25 MED ORDER — OXYCODONE HCL 5 MG PO TABS: 5.0000 mg | ORAL_TABLET | Freq: Four times a day (QID) | ORAL | Status: DC | PRN

## 2015-09-25 MED ORDER — KETOROLAC TROMETHAMINE 30 MG/ML IJ SOLN
30.0000 mg | Freq: Once | INTRAMUSCULAR | Status: AC
  Administered 2015-09-25: 30 mg via INTRAVENOUS

## 2015-09-25 MED ORDER — ONDANSETRON HCL 4 MG/2ML IJ SOLN
4.0000 mg | Freq: Once | INTRAMUSCULAR | Status: AC
  Administered 2015-09-25: 4 mg via INTRAVENOUS

## 2015-09-25 MED ORDER — CEPHALEXIN 500 MG PO CAPS: 500.0000 mg | ORAL_CAPSULE | Freq: Three times a day (TID) | ORAL | Status: DC

## 2015-09-25 MED ORDER — MORPHINE SULFATE (PF) 4 MG/ML IV SOLN
4.0000 mg | Freq: Once | INTRAVENOUS | Status: AC
  Administered 2015-09-25: 4 mg via INTRAVENOUS

## 2015-09-25 MED ORDER — SODIUM CHLORIDE 0.9 % IV BOLUS (SEPSIS)
1000.0000 mL | Freq: Once | INTRAVENOUS | Status: AC
  Administered 2015-09-25: 1000 mL via INTRAVENOUS

## 2015-09-25 MED ORDER — ONDANSETRON HCL 4 MG PO TABS: 4.0000 mg | ORAL_TABLET | Freq: Three times a day (TID) | ORAL | Status: DC | PRN

## 2015-09-25 NOTE — ED Notes (Signed)
Pt to ct scan.

## 2015-09-25 NOTE — ED Provider Notes (Signed)
[facility] Emergency Department Provider Note   ____________________________________________  Time seen: Approximately 6:50 PM  I have reviewed the triage vital signs and the nursing notes.   HISTORY  Chief Complaint Flank Pain    HPI Michelle Deleon is a 25 y.o. female with prior history of kidney stones as well as urinary tract infections who presents for evaluation of 2 days of left flank pain radiating to left lower abdomen, increased urinary frequency and hesitancy, hematuria, gradual onset, constant, severe, no modifying factors. No fevers or chills per she has had one episode of diarrhea today and 2 episodes of nonbloody nonbilious emesis. No chest pain or difficulty breathing. Denies bnormal vaginal bleeding or vaginal discharge.   No past medical history on file.  There are no active problems to display for this patient.   No past surgical history on file.  Current Outpatient Rx  Name  Route  Sig  Dispense  Refill  . cephALEXin (KEFLEX) 500 MG capsule   Oral   Take 1 capsule (500 mg total) by mouth 3 (three) times daily.   30 capsule   0   . ibuprofen (ADVIL,MOTRIN) 800 MG tablet   Oral   Take 1 tablet (800 mg total) by mouth every 8 (eight) hours as needed.   20 tablet   0   . loratadine-pseudoephedrine (CLARITIN-D 12 HOUR) 5-120 MG per tablet   Oral   Take 1 tablet by mouth 2 (two) times daily.   20 tablet   0   . ondansetron (ZOFRAN) 4 MG tablet   Oral   Take 1 tablet (4 mg total) by mouth daily as needed for nausea or vomiting.   20 tablet   0   . ondansetron (ZOFRAN) 4 MG tablet   Oral   Take 1 tablet (4 mg total) by mouth every 8 (eight) hours as needed for nausea or vomiting.   12 tablet   0   . oxyCODONE (ROXICODONE) 5 MG immediate release tablet   Oral   Take 1 tablet (5 mg total) by mouth every 6 (six) hours as needed for moderate pain. Do not drive while taking this medication.   12 tablet   0   .  oxyCODONE-acetaminophen (ROXICET) 5-325 MG tablet   Oral   Take 1 tablet by mouth every 4 (four) hours as needed for severe pain.   15 tablet   0   . tamsulosin (FLOMAX) 0.4 MG CAPS capsule   Oral   Take 1 capsule (0.4 mg total) by mouth daily.   14 capsule   0   . traMADol (ULTRAM) 50 MG tablet   Oral   Take 1 tablet (50 mg total) by mouth every 6 (six) hours as needed.   12 tablet   0     Allergies Review of patient's allergies indicates no known allergies.  No family history on file.  Social History Social History  Substance Use Topics  . Smoking status: Current Every Day Smoker -- 1.00 packs/day    Types: Cigarettes  . Smokeless tobacco: Not on file  . Alcohol Use: No    Review of Systems Constitutional: No fever/chills Eyes: No visual changes. ENT: No sore throat. Cardiovascular: Denies chest pain. Respiratory: Denies shortness of breath. Gastrointestinal: + abdominal pain.  + nausea, + vomiting.  No diarrhea.  No constipation. Genitourinary: Positive for dysuria, increased urinary frequency and hesitancy, hematuria. Musculoskeletal: Positive for left flank pain. Skin: Negative for rash. Neurological: Negative for  headaches, focal weakness or numbness.  10-point ROS otherwise negative.  ____________________________________________   PHYSICAL EXAM:  Filed Vitals:   09/25/15 2000 09/25/15 2030 09/25/15 2100 09/25/15 2146  BP: 110/54 102/57 117/51 114/68  Pulse: 63 59 60 68  Temp:      TempSrc:      Resp:    16  Height:      Weight:      SpO2: 100% 100% 100% 100%    VITAL SIGNS: ED Triage Vitals  Enc Vitals Group     BP 09/25/15 1805 133/105 mmHg     Pulse Rate 09/25/15 1805 121     Resp 09/25/15 1805 24     Temp 09/25/15 1805 98.6 F (37 C)     Temp Source 09/25/15 1805 Oral     SpO2 09/25/15 1805 97 %     Weight 09/25/15 1805 213 lb (96.616 kg)     Height 09/25/15 1805  (1.676 m)     Head Cir --      Peak Flow --      Pain Score  09/25/15 1805 10     Pain Loc --      Pain Edu? --      Excl. in GC? --     Constitutional: Alert and oriented. In distress secondary to pain. Eyes: Conjunctivae are normal. PERRL. EOMI. Head: Atraumatic. Nose: No congestion/rhinnorhea. Mouth/Throat: Mucous membranes are moist.  Oropharynx non-erythematous. Neck: No stridor.  Supple without meningismus. Cardiovascular: Tachycardic rate, regular rhythm. Grossly normal heart sounds.  Good peripheral circulation. Respiratory: Normal respiratory effort.  No retractions. Lungs CTAB. Gastrointestinal: Soft and nontender. No distention.  No CVA tenderness. Genitourinary: Deferred Musculoskeletal: No lower extremity tenderness nor edema.  No joint effusions. Neurologic:  Normal speech and language. No gross focal neurologic deficits are appreciated. No gait instability. Skin:  Skin is warm, dry and intact. No rash noted. Psychiatric: Mood and affect are normal. Speech and behavior are normal.  ____________________________________________   LABS (all labs ordered are listed, but only abnormal results are displayed)  Labs Reviewed  URINALYSIS COMPLETEWITH MICROSCOPIC ([facility] ONLY) - Abnormal; Notable for the following:    Color, Urine YELLOW (*)    APPearance TURBID (*)    Hgb urine dipstick 3+ (*)    Protein, ur 100 (*)    Leukocytes, UA 3+ (*)    Bacteria, UA RARE (*)    Squamous Epithelial / LPF 6-30 (*)    All other components within normal limits  BASIC METABOLIC PANEL - Abnormal; Notable for the following:    CO2 21 (*)    All other components within normal limits  URINE CULTURE  CBC  POC URINE PREG, ED  POCT PREGNANCY, URINE   ____________________________________________  EKG  none ____________________________________________  RADIOLOGY  CT abdomen and pelvis IMPRESSION: 1. Mild left-sided hydronephrosis and hydroureter. Fairly prominent inflammation and fluid stranding about the left ureter but no obstructing  ureteral calculus identified. Findings could, therefore, represent recently passed stone or ascending ureteral infection. Recommend correlation with urinalysis and clinical symptoms to help differentiate between these 2 possibilities. Of note, there is no inflammation about the left kidney to suggest pyelonephritis. Bladder is decompressed. 2. Remainder of the abdomen and pelvis CT is unremarkable, as detailed above. ____________________________________________   PROCEDURES  Procedure(s) performed: None  Critical Care performed: No  ____________________________________________   INITIAL IMPRESSION / ASSESSMENT AND PLAN / ED COURSE  Pertinent labs & imaging results that were available during my care of  the patient were reviewed by me and considered in my medical decision making (see chart for details).  Humphrey Rollsanetta D Alexa is a 25 y.o. female with prior history of kidney stones as well as urinary tract infections who presents for evaluation of 2 days of left flank pain, increased urinary frequency and hesitancy, hematuria, on exam, she is in distress secondary to pain. She is tachycardic but afebrile, intermittent May mildly tachypneic however the remainder of her vital signs are stable. She has no leukocytosis and I suspect these vital sign abnormalities are pain related, we'll treat her pain and reassess. Urinalysis is concerning for either urinary tract infection or possibly obstructing/infected kidney stone. Negative pregnancy test. We'll treat her pain, hydrate her, obtain CT renal protocol, reassess for disposition.  ----------------------------------------- 9:38 PM on 09/25/2015 ----------------------------------------- The patient reports that he began improvement of her pain at this time. Her vital signs have normalized with pain control, she is afebrile, has no leukocytosis and I do not think that this represents sepsis. She is sitting up in bed, appears comfortable. She has not  vomited since arrival to the emergency department. CT renal stone protocol shows no stone, mild left-sided hydronephrosis and hydroureter with some stranding about the left ureter with findings that could represent recently passed stone versus ascending ureteral infection. There is no pyelonephritis. We'll treat for ascending urinary tract infection with Keflex. I discussed return precautions with her, need for close follow-up and she is comfortable with the discharge plan. DC home.  ____________________________________________   FINAL CLINICAL IMPRESSION(S) / ED DIAGNOSES  Final diagnoses:  Acute left flank pain  UTI (lower urinary tract infection)      NEW MEDICATIONS STARTED DURING THIS VISIT:  Discharge Medication List as of 09/25/2015  9:41 PM    START taking these medications   Details  !! ondansetron (ZOFRAN) 4 MG tablet Take 1 tablet (4 mg total) by mouth every 8 (eight) hours as needed for nausea or vomiting., Starting 09/25/2015, Until Discontinued, Print    oxyCODONE (ROXICODONE) 5 MG immediate release tablet Take 1 tablet (5 mg total) by mouth every 6 (six) hours as needed for moderate pain. Do not drive while taking this medication., Starting 09/25/2015, Until Discontinued, Print     !! - Potential duplicate medications found. Please discuss with provider.       Note:  This document was prepared using Dragon voice recognition software and may include unintentional dictation errors.    Gayla DossEryka A Gohan Collister, MD 09/25/15 2154

## 2015-09-25 NOTE — ED Notes (Signed)
Pt assisted up to restroom

## 2015-09-25 NOTE — ED Notes (Signed)
Pt crying. Left lower back pain that radiates to lower abd left side. Reports history kidney stone, pain with urination and bloody urine.

## 2015-09-25 NOTE — ED Notes (Signed)
Pt in obvious extreme pain. Pt is tearful and moaning and states that her back hurts. Attempted to make pt as comfortable as possible.

## 2015-09-25 NOTE — ED Notes (Signed)
Report from kim, rn.  

## 2015-09-25 NOTE — ED Notes (Signed)
Pt assisted to restroom with staff.

## 2015-09-28 LAB — URINE CULTURE

## 2016-07-06 ENCOUNTER — Emergency Department
Admission: EM | Admit: 2016-07-06 | Discharge: 2016-07-06 | Disposition: A | Discharge status: Home / Self Care | Payer: Medicaid Other | Attending: Emergency Medicine | Admitting: Emergency Medicine

## 2016-07-06 ENCOUNTER — Encounter: Payer: Self-pay | Admitting: Emergency Medicine

## 2016-07-06 DIAGNOSIS — F17210 Nicotine dependence, cigarettes, uncomplicated: Secondary | ICD-10-CM | POA: Insufficient documentation

## 2016-07-06 DIAGNOSIS — J069 Acute upper respiratory infection, unspecified: Secondary | ICD-10-CM | POA: Insufficient documentation

## 2016-07-06 DIAGNOSIS — R05 Cough: Secondary | ICD-10-CM | POA: Diagnosis present

## 2016-07-06 DIAGNOSIS — B9789 Other viral agents as the cause of diseases classified elsewhere: Secondary | ICD-10-CM

## 2016-07-06 LAB — POCT RAPID STREP A: STREPTOCOCCUS, GROUP A SCREEN (DIRECT): NEGATIVE

## 2016-07-06 MED ORDER — ACETAMINOPHEN-CODEINE 120-12 MG/5ML PO SOLN
10.0000 mL | Freq: Once | ORAL | Status: AC
  Administered 2016-07-06: 10 mL via ORAL

## 2016-07-06 MED ORDER — ACETAMINOPHEN-CODEINE 120-12 MG/5ML PO SOLN
ORAL | Status: AC
  Filled 2016-07-06: qty 2

## 2016-07-06 MED ORDER — ACETAMINOPHEN-CODEINE #3 300-30 MG PO TABS: 1.0000 | ORAL_TABLET | Freq: Two times a day (BID) | ORAL | 0 refills | Status: DC | PRN

## 2016-07-06 MED ORDER — PSEUDOEPH-BROMPHEN-DM 30-2-10 MG/5ML PO SYRP: 5.0000 mL | ORAL_SOLUTION | Freq: Four times a day (QID) | ORAL | 0 refills | Status: DC | PRN

## 2016-07-06 MED ORDER — FLUTICASONE PROPIONATE 50 MCG/ACT NA SUSP: 2.0000 | Freq: Every day | NASAL | 0 refills | Status: DC

## 2016-07-06 MED ORDER — BENZONATATE 100 MG PO CAPS: 100.0000 mg | ORAL_CAPSULE | Freq: Three times a day (TID) | ORAL | 0 refills | Status: DC | PRN

## 2016-07-06 MED ORDER — BENZONATATE 100 MG PO CAPS
200.0000 mg | ORAL_CAPSULE | Freq: Once | ORAL | Status: AC
  Administered 2016-07-06: 200 mg via ORAL
  Filled 2016-07-06: qty 2

## 2016-07-06 NOTE — ED Notes (Addendum)
Sore throat and congestion that began on Tuesday. Pt now c/o nonproductive cough that began this AM. OTC medications taken without relief

## 2016-07-06 NOTE — ED Notes (Signed)
Pt alert and oriented X4, active, cooperative, pt in NAD. RR even and unlabored, color WNL.  Pt informed to return if any life threatening symptoms occur.   

## 2016-07-06 NOTE — ED Triage Notes (Signed)
Patient with complaint of sore throat that stated Tuesday night. Patient reports cough and congestion that started today. Patient has taken OTC medications with no improvement.

## 2016-07-06 NOTE — Discharge Instructions (Signed)
Your exam is consistent with a upper respiratory infection and cough. Take the prescription meds as directed. Drink plenty of fluids and start a daily allergy medicine. [facility].

## 2016-07-07 NOTE — ED Provider Notes (Signed)
[facility] Emergency Department Provider Note ____________________________________________  Time seen: 2118  I have reviewed the triage vital signs and the nursing notes.  HISTORY  Chief Complaint  Sore Throat; Cough; and Nasal Congestion  HPI Michelle Deleon is a 26 y.o. female is into the ED for evaluation of sore throat that started on Tuesday night, and cough and congestion that started today. Patient taking over-the-counter cough medicines without benefit. She is taking Alka-Seltzer, Hall's, and DayQuil without significant relief of her symptoms. She denies any interim fevers, chills, sweats. She describes a sore throat, and chest discomfort secondary to the nonproductive cough. She did not receive the seasonal flu vaccine.  History reviewed. No pertinent past medical history.  There are no active problems to display for this patient.   History reviewed. No pertinent surgical history.  Prior to Admission medications   Medication Sig Start Date End Date Taking? Authorizing Provider  acetaminophen-codeine (TYLENOL #3) 300-30 MG tablet Take 1 tablet by mouth 2 (two) times daily as needed for moderate pain. 07/06/16   Amauri Keefe V Bacon Peyten Punches, PA-C  benzonatate (TESSALON PERLES) 100 MG capsule Take 1 capsule (100 mg total) by mouth 3 (three) times daily as needed for cough (Take 1-2 per dose). 07/06/16   Leilan Bochenek V Bacon Cleave Ternes, PA-C  brompheniramine-pseudoephedrine-DM 30-2-10 MG/5ML syrup Take 5 mLs by mouth 4 (four) times daily as needed. 07/06/16   Denim Start V Bacon Tauheed Mcfayden, PA-C  cephALEXin (KEFLEX) 500 MG capsule Take 1 capsule (500 mg total) by mouth 3 (three) times daily. 09/25/15   Gayla Doss, MD  fluticasone (FLONASE) 50 MCG/ACT nasal spray Place 2 sprays into both nostrils daily. 07/06/16   Rebekha Diveley V Bacon Kairav Russomanno, PA-C  ibuprofen (ADVIL,MOTRIN) 800 MG tablet Take 1 tablet (800 mg total) by mouth every 8 (eight) hours as needed. 02/02/15   Phineas Semen, MD   ondansetron (ZOFRAN) 4 MG tablet Take 1 tablet (4 mg total) by mouth daily as needed for nausea or vomiting. 02/02/15   Phineas Semen, MD  ondansetron (ZOFRAN) 4 MG tablet Take 1 tablet (4 mg total) by mouth every 8 (eight) hours as needed for nausea or vomiting. 09/25/15   Gayla Doss, MD  oxyCODONE (ROXICODONE) 5 MG immediate release tablet Take 1 tablet (5 mg total) by mouth every 6 (six) hours as needed for moderate pain. Do not drive while taking this medication. 09/25/15   Gayla Doss, MD  oxyCODONE-acetaminophen (ROXICET) 5-325 MG tablet Take 1 tablet by mouth every 4 (four) hours as needed for severe pain. 02/02/15   Phineas Semen, MD  tamsulosin (FLOMAX) 0.4 MG CAPS capsule Take 1 capsule (0.4 mg total) by mouth daily. 02/02/15   Phineas Semen, MD  traMADol (ULTRAM) 50 MG tablet Take 1 tablet (50 mg total) by mouth every 6 (six) hours as needed. 10/09/14   III Rosalyn Gess, PA-C    Allergies Patient has no known allergies.  No family history on file.  Social History Social History  Substance Use Topics  . Smoking status: Current Some Day Smoker    Packs/day: 1.00    Types: Cigarettes  . Smokeless tobacco: Never Used  . Alcohol use No    Review of Systems  Constitutional: Negative for fever. Eyes: Negative for visual changes. ENT: Positive for sore throat. Cardiovascular: Negative for chest pain. Respiratory: Negative for shortness of breath. Reports nonproductive cough. Gastrointestinal: Negative for abdominal pain, vomiting and diarrhea. Genitourinary: Negative for dysuria. Musculoskeletal: Negative for back pain. Skin:  Negative for rash. Neurological: Negative for headaches, focal weakness or numbness. ____________________________________________  PHYSICAL EXAM:  VITAL SIGNS: ED Triage Vitals  Enc Vitals Group     BP 07/06/16 2059 136/60     Pulse Rate 07/06/16 2059 83     Resp 07/06/16 2059 18     Temp 07/06/16 2059 98 F (36.7 C)     Temp Source  07/06/16 2059 Oral     SpO2 07/06/16 2059 97 %     Weight 07/06/16 2059 215 lb (97.5 kg)     Height 07/06/16 2059 5\' 6"  (1.676 m)     Head Circumference --      Peak Flow --      Pain Score 07/06/16 2100 7     Pain Loc --      Pain Edu? --      Excl. in GC? --     Constitutional: Alert and oriented. Well appearing and in no distress. Head: Normocephalic and atraumatic. Eyes: Conjunctivae are normal. PERRL. Normal extraocular movements Ears: Canals clear. TMs intact bilaterally. Nose: No congestion/rhinorrhea/epistaxis. Mouth/Throat: Mucous membranes are moist. Uvula is midline and tonsils are flat. No oropharyngeal lesions are appreciated. There is general oropharyngeal erythema noted. Neck: Supple. No thyromegaly. Hematological/Lymphatic/Immunological: No cervical lymphadenopathy. Cardiovascular: Normal rate, regular rhythm. Normal distal pulses. Respiratory: Normal respiratory effort. No wheezes/rales/rhonchi. Gastrointestinal: Soft and nontender. No distention. Musculoskeletal: Nontender with normal range of motion in all extremities.  Neurologic:  Normal gait without ataxia. Normal speech and language. No gross focal neurologic deficits are appreciated. Skin:  Skin is warm, dry and intact. No rash noted. ____________________________________________   LABS (pertinent positives/negatives) Labs Reviewed  POCT RAPID STREP A   Rapid strep negative ____________________________________________  PROCEDURES  Tessalon Perles 200 mg PO Tylenol #3 elixir 10 mg PO ____________________________________________  INITIAL IMPRESSION / ASSESSMENT AND PLAN / ED COURSE  Patient with likely viral URI with postnasal drainage. She also has a bronchospasm component to her symptoms. No indication of an acute infectious process as a rapid strep is negative. She'll be discharged with instructions to dose Tylenol No. 3, Tessalon Perles, and Flonase as directed. She should also use over-the-counter  Delsym, daily allergy medicine, and pseudoephedrine for additional symptom management. She should follow up with her primary care provider or [facility] for ongoing symptom management. Return precautions are reviewed. ____________________________________________  FINAL CLINICAL IMPRESSION(S) / ED DIAGNOSES  Final diagnoses:  Viral URI with cough      Lissa HoardJenise V Bacon Zunaira Lamy, PA-C 07/07/16 0202    Phineas SemenGraydon Goodman, MD 07/07/16 1402

## 2016-09-26 IMAGING — CT CT RENAL STONE PROTOCOL
3 of 4 series · 8 of 46 positions shown, 15 images · non-contrast
Comparison: None.

CLINICAL DATA: Left lower back pain radiating to lower abdomen left
side with urination and bloody urine.

EXAM:
CT ABDOMEN AND PELVIS WITHOUT CONTRAST
TECHNIQUE: Multidetector CT imaging of the abdomen and pelvis was performed
following the standard protocol without IV contrast.

[Series 4: lung · axial · 0.72mm/px · z∈[-115,-55]mm · 4 of 20 slices shown, 9 images]
[im 4/20  soft-tissue]
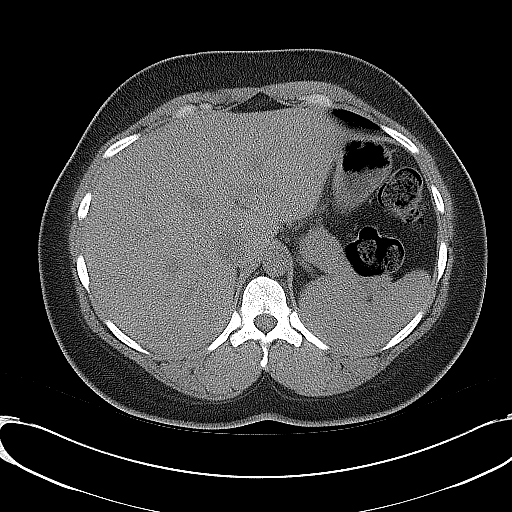
[im 4/20  lung]
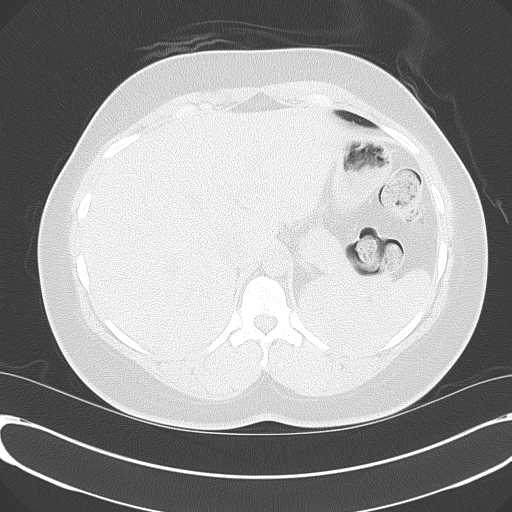
[im 4/20  bone]
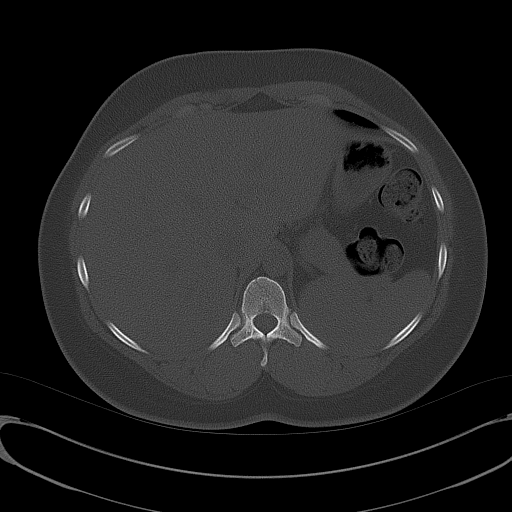
[im 8/20  soft-tissue]
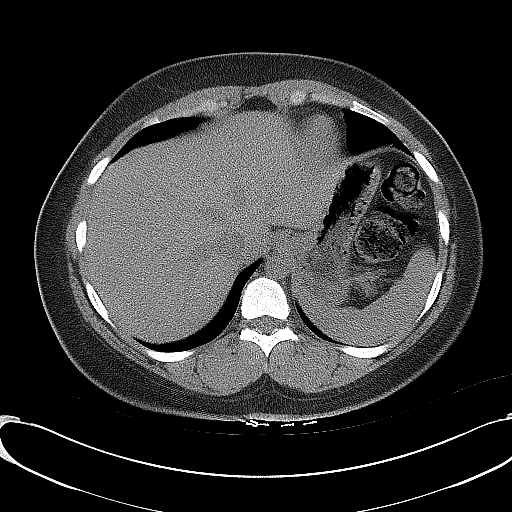
[im 8/20  lung]
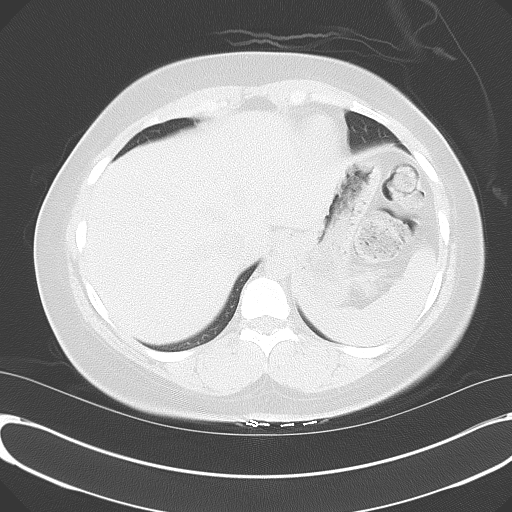
[im 12/20  soft-tissue]
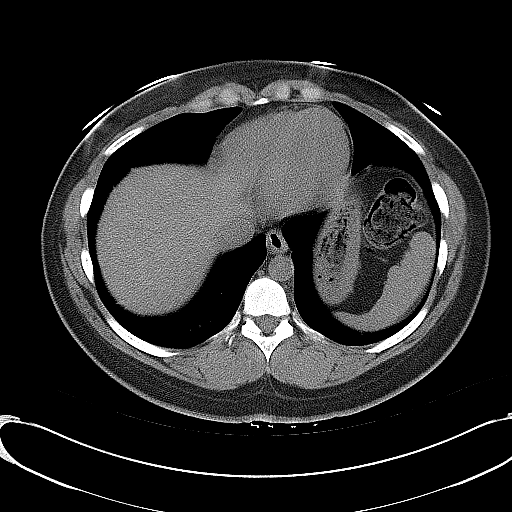
[im 12/20  lung]
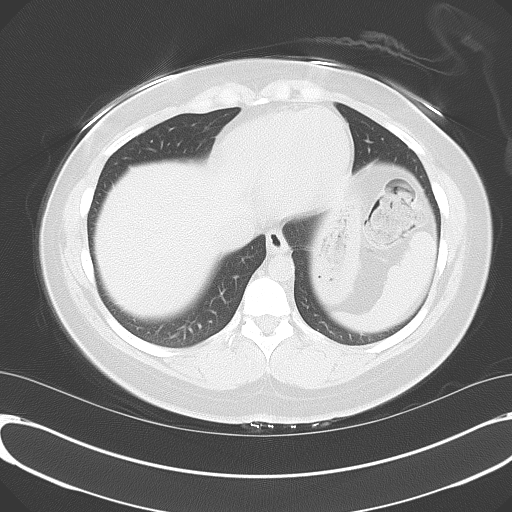
[im 16/20  soft-tissue]
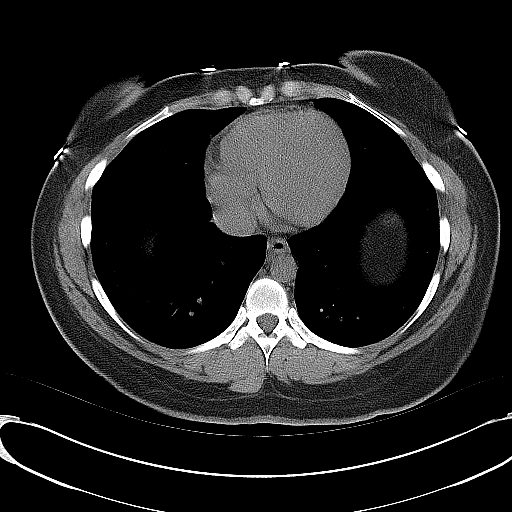
[im 16/20  lung]
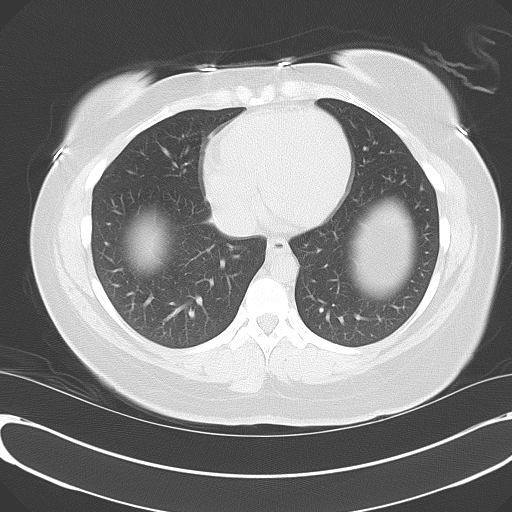

[Series 5: coronal · coronal · 0.72mm/px · 3 of 134 slices shown, 4 images]
[im 45/134  soft-tissue]
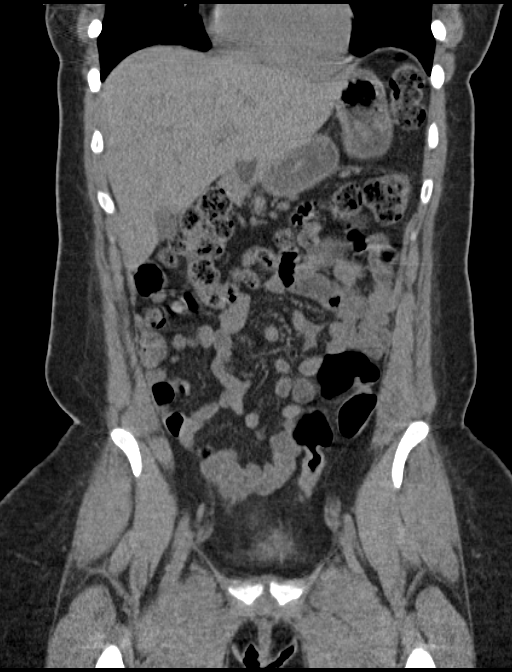
[im 60/134  soft-tissue]
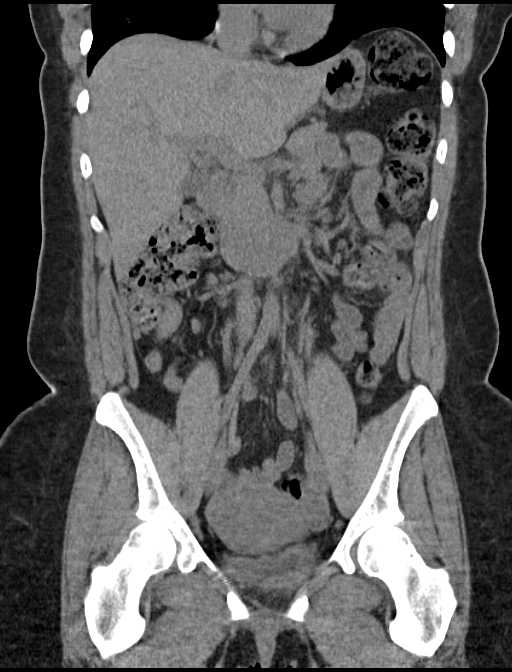
[im 60/134  bone]
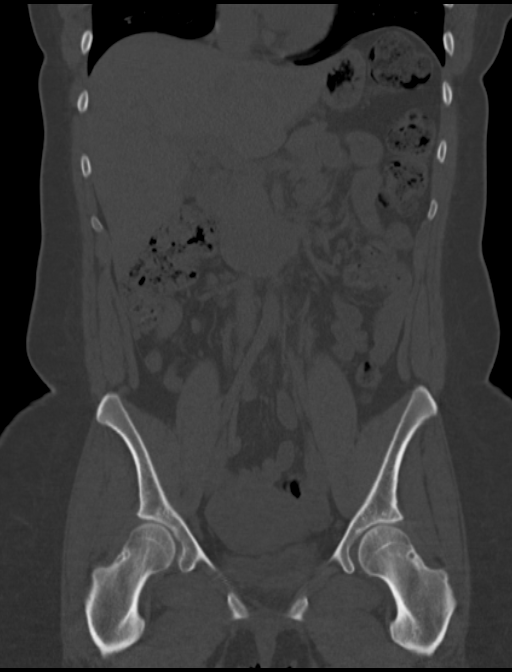
[im 74/134  soft-tissue]
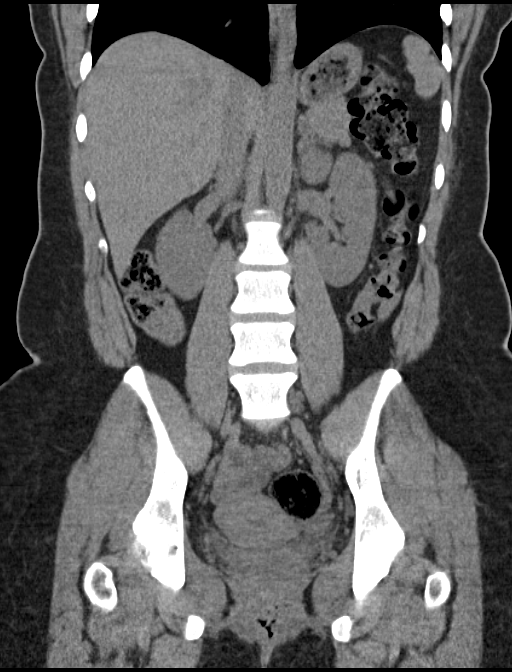

[Series 6: sagittal · sagittal · 0.59mm/px · 1 of 179 slices shown, 2 images]
[im 60/179  soft-tissue]
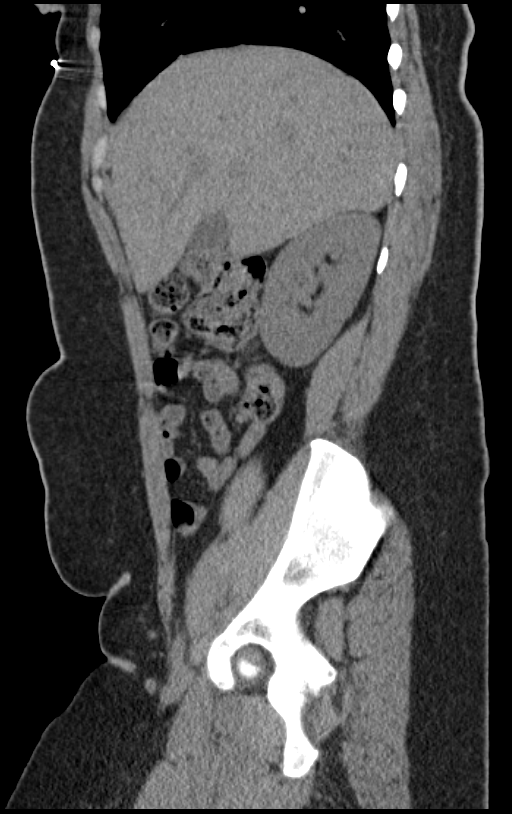
[im 60/179  bone]
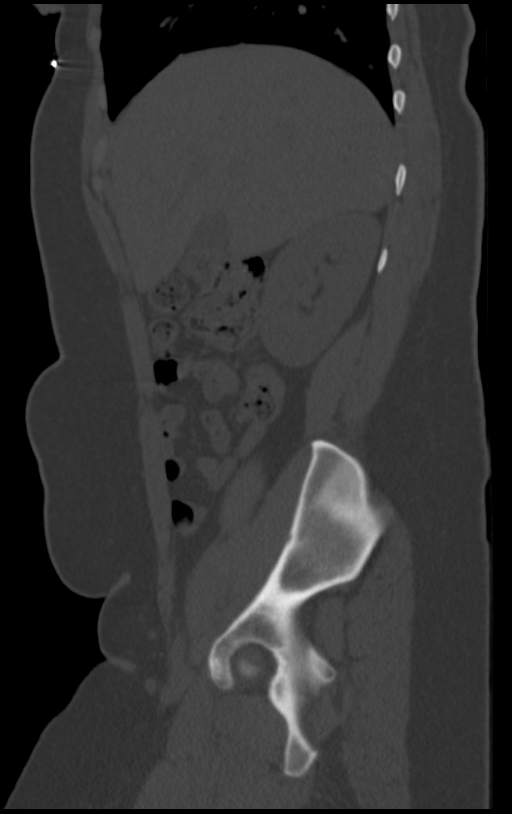

[8 of 46 positions shown; findings below may reference images not displayed]

FINDINGS: Lower chest:  No acute findings.

Hepatobiliary: No mass visualized on this un-enhanced exam.

Pancreas: No mass or inflammatory process identified on this
un-enhanced exam.

Spleen: Within normal limits in size.

Adrenals/Urinary Tract: There is mild left-sided hydronephrosis and
hydroureter. There is considerable periureteral inflammation and
fluid stranding on the left. No renal or ureteral calculi
identified. No bladder stones seen. Bladder is decompressed limiting
characterization of its walls.

Right kidney is unremarkable without stone or hydronephrosis. Right
ureter appears normal.

Stomach/Bowel: Bowel is normal in caliber. No bowel wall thickening
or evidence of bowel wall inflammation seen. Moderate amount of
stool throughout the nondistended colon. Appendix is not well seen
but there are no inflammatory changes about the cecum to suggest
acute appendicitis.

Vascular/Lymphatic: Mildly prominent lymph nodes are seen within the
central mesentery and right lower quadrant, likely an incidental
finding.

Reproductive: No mass or other significant abnormality.

Other: Trace free fluid in the lower pelvis is likely physiologic in
nature. No significant free fluid elsewhere in the abdomen or
pelvis. No abscess collections seen. No free intraperitoneal air.

Musculoskeletal:  No acute or suspicious osseous lesion.
IMPRESSION: 1. Mild left-sided hydronephrosis and hydroureter. Fairly prominent
inflammation and fluid stranding about the left ureter but no
obstructing ureteral calculus identified. Findings could, therefore,
represent recently passed stone or ascending ureteral infection.
Recommend correlation with urinalysis and clinical symptoms to help
differentiate between these 2 possibilities. Of note, there is no
inflammation about the left kidney to suggest pyelonephritis.
Bladder is decompressed.
2. Remainder of the abdomen and pelvis CT is unremarkable, as
detailed above.

## 2017-03-18 ENCOUNTER — Emergency Department
Admission: EM | Admit: 2017-03-18 | Discharge: 2017-03-18 | Disposition: A | Discharge status: Home / Self Care | Payer: Medicaid Other | Attending: Emergency Medicine | Admitting: Emergency Medicine

## 2017-03-18 ENCOUNTER — Encounter: Payer: Self-pay | Admitting: Emergency Medicine

## 2017-03-18 ENCOUNTER — Other Ambulatory Visit: Payer: Self-pay

## 2017-03-18 DIAGNOSIS — F17210 Nicotine dependence, cigarettes, uncomplicated: Secondary | ICD-10-CM | POA: Diagnosis not present

## 2017-03-18 DIAGNOSIS — K047 Periapical abscess without sinus: Secondary | ICD-10-CM

## 2017-03-18 DIAGNOSIS — K0889 Other specified disorders of teeth and supporting structures: Secondary | ICD-10-CM | POA: Diagnosis present

## 2017-03-18 MED ORDER — HYDROCODONE-ACETAMINOPHEN 5-325 MG PO TABS
1.0000 | ORAL_TABLET | Freq: Once | ORAL | Status: AC
  Administered 2017-03-18: 1 via ORAL
  Filled 2017-03-18: qty 1

## 2017-03-18 MED ORDER — CLINDAMYCIN PHOSPHATE 600 MG/4ML IJ SOLN
600.0000 mg | Freq: Once | INTRAMUSCULAR | Status: AC
  Administered 2017-03-18: 600 mg via INTRAMUSCULAR
  Filled 2017-03-18: qty 4

## 2017-03-18 MED ORDER — ACETAMINOPHEN-CODEINE #3 300-30 MG PO TABS: 1.0000 | ORAL_TABLET | Freq: Four times a day (QID) | ORAL | 0 refills | Status: DC | PRN

## 2017-03-18 MED ORDER — CLINDAMYCIN HCL 150 MG PO CAPS: ORAL_CAPSULE | ORAL | 0 refills | Status: AC

## 2017-03-18 NOTE — ED Notes (Signed)
Pt states that the roof of her mouth started hurting Friday and has gotten worse. Pt denies trauma or any sort. Pt is grimacing and in pain. Pain is 10/10.Pt is awaiting EDP.

## 2017-03-18 NOTE — Discharge Instructions (Signed)
Continuing taking medication only as directed. Begin taking clindamycin 2 capsules every 6 hours starting tonight. Use the drug discount card given to today.  OPTIONS FOR DENTAL FOLLOW UP CARE  [place] Department of Health and Human Services - Local Safety Net Dental Clinics TripDoors.comhttp://www.ncdhhs.gov/dph/oralhealth/services/safetynetclinics.htm   [facility] [phone])  [facility] [phone])  [facility] [phone]  [facility] [phone])  [facility] [phone]) This clinic caters to the indigent population and is on a lottery system. Location: [employer], [address] [facility], [address], [place] [facility] Hours: Wednesdays from 6pm - 9pm, patients seen by a lottery system. For dates, call or go to [email or web address] Services: Cleanings, fillings and simple extractions. Payment Options: DENTAL WORK IS FREE OF CHARGE. Bring proof of income or support. Best way to get seen: Arrive at 5:15 pm - this is a lottery, NOT first come/first serve, so arriving earlier will not increase your chances of being seen.     [facility] [phone] Select option 1 for emergencies   Location: [facility], [facility], [address], [place] [facility] Hours: No walk-ins accepted - call the day before to schedule an appointment. Check in times are 9:30 am and 1:30 pm. Services: Simple extractions, temporary fillings, pulpectomy/pulp debridement, uncomplicated abscess drainage. Payment Options: PAYMENT IS DUE AT THE TIME OF SERVICE.  Fee is usually $100-200, additional surgical procedures (e.g. abscess drainage) may be extra. Cash, checks, Visa/MasterCard accepted.  Can file Medicaid if patient is covered for dental - patient should call case worker to check. No discount for [facility] patients. Best way to get seen: MUST call the  day before and get onto the schedule. Can usually be seen the next 1-2 days. No walk-ins accepted.     [facility] [phone]   Location: [facility], [address], [place] [facility] Hours: M, W, Th, F 8am or 1:30pm, Tues 9a or 1:30 - first come/first served. Services: Simple extractions, temporary fillings, uncomplicated abscess drainage.  You do not need to be an [place] resident. Payment Options: PAYMENT IS DUE AT THE TIME OF SERVICE. Dental insurance, otherwise sliding scale - bring proof of income or support. Depending on income and treatment needed, cost is usually $50-200. Best way to get seen: Arrive early as it is first come/first served.     [facility] [phone]   Location: [address] Clinic Hours: Mon-Thu 8a-5p Services: Most basic dental services including extractions and fillings. Payment Options: PAYMENT IS DUE AT THE TIME OF SERVICE. Sliding scale, up to 50% off - bring proof if income or support. Medicaid with dental option accepted. Best way to get seen: Call to schedule an appointment, can usually be seen within 2 weeks OR they will try to see walk-ins - show up at 8a or 2p (you may have to wait).     [facility] [phone] [place] RESIDENTS ONLY   Location: [facility], [address], [place], [place] [id number] Clinic Hours: By appointment only. Monday - Thursday 8am-5pm, Friday 8am-12pm Services: Cleanings, fillings, extractions. Payment Options: PAYMENT IS DUE AT THE TIME OF SERVICE. Cash, Visa or MasterCard. Sliding scale - $30 minimum per service. Best way to get seen: Come in to office, complete packet and make an appointment - need proof of income or support monies for each household member and proof of [place]  residence. Usually takes about a month to get in.     [facility] [phone]   Location: [address], [place] Clinic Hours: Walk-in Urgent Care Dental Services are offered Monday-Friday mornings only. The numbers of emergencies accepted daily is limited to the number of providers available. Maximum 15 - Mondays, Wednesdays & Thursdays Maximum 10 - Tuesdays & Fridays Services: You do not need to be a [facility] resident to be seen for a dental emergency. Emergencies are defined as pain, swelling, abnormal bleeding, or dental trauma. Walkins will receive x-rays if needed. NOTE: Dental cleaning is not an emergency. Payment Options: PAYMENT IS DUE AT THE TIME OF SERVICE. Minimum co-pay is $40.00 for uninsured patients. Minimum co-pay is $3.00 for Medicaid with dental coverage. Dental Insurance is accepted and must be presented at time of visit. Medicare does not cover dental. Forms of payment: Cash, credit card, checks. Best way to get seen: If not previously registered with the clinic, walk-in dental registration begins at 7:15 am and is on a first come/first serve basis. If previously registered with the clinic, call to make an appointment.     [facility] [place] ONLY   Location: [address] [place], [place] Clinic Hours: Mon-Thu 10a-2p Services: Extractions only! Payment Options: FREE (donations accepted) - bring proof of income or support Best way to get seen: Call and schedule an appointment OR come at 8am on the 1st Monday of every month (except for holidays) when it is first come/first served.     [facility] [phone]   Location: [place], [place] Clinic Hours: Friday mornings Services, Payment Options, Best way to get seen: Call for info

## 2017-03-18 NOTE — ED Provider Notes (Signed)
[facility] Emergency Department Provider Note  ____________________________________________   First MD Initiated Contact with Patient 03/18/17 1258     (approximate)  I have reviewed the triage vital signs and the nursing notes.   HISTORY  Chief Complaint Dental Pain   HPI Michelle Deleon is a 26 y.o. female is here complaining of dental pain. Patient states that she has an abscess on the right upper side of her mouth. This caused great pain and she is unable to eat or drink without increasing her pain. She has not called and made an appointment with a dentist. atient states that she discontinue smoking 7 months ago.he is unaware of any fever or chills. There's been no nausea or vomiting.urrently she rates her pain as a 10 over 10.   History reviewed. No pertinent past medical history.  There are no active problems to display for this patient.   History reviewed. No pertinent surgical history.  Prior to Admission medications   Medication Sig Start Date End Date Taking? Authorizing Provider  acetaminophen-codeine (TYLENOL #3) 300-30 MG tablet Take 1 tablet every 6 (six) hours as needed by mouth for moderate pain. 03/18/17   Michelle Deleon, Michelle Yett Deleon, Michelle Deleon  clindamycin (CLEOCIN) 150 MG capsule Take 2 capsule every 6 hours starting tonight 03/18/17   Michelle Deleon, Michelle Giacobbe Deleon, Michelle Deleon    Allergies Amoxicillin  No family history on file.  Social History Social History   Tobacco Use  . Smoking status: Current Some Day Smoker    Packs/day: 1.00    Types: Cigarettes  . Smokeless tobacco: Never Used  Substance Use Topics  . Alcohol use: No  . Drug use: Not on file    Review of Systems Constitutional: No fever/chills Eyes: No visual changes. ENT: positive dental pain. Cardiovascular: Denies chest pain. Respiratory: Denies shortness of breath. Gastrointestinal:  No nausea, no vomiting.  ___________________________________________   PHYSICAL EXAM:  VITAL  SIGNS: ED Triage Vitals  Enc Vitals Group     BP 03/18/17 1239 129/77     Pulse Rate 03/18/17 1239 92     Resp 03/18/17 1239 16     Temp 03/18/17 1239 98.5 F (36.9 C)     Temp Source 03/18/17 1239 Oral     SpO2 03/18/17 1239 97 %     Weight 03/18/17 1240 215 lb (97.5 kg)     Height --      Head Circumference --      Peak Flow --      Pain Score 03/18/17 1238 10     Pain Loc --      Pain Edu? --      Excl. in GC? --    Constitutional: Alert and oriented. Patient is in no acute distress but is crying in the exam room. Eyes: Conjunctivae are normal.  Head: Atraumatic. Nose: No congestion/rhinnorhea. Mouth/Throat: Mucous membranes are moist.  Oropharynx non-erythematous. There is an abscess present in the upper soft palate beside a distal premolar that is in need of repair. There is no drainage present. Neck: No stridor.   Hematological/Lymphatic/Immunilogical: No cervical lymphadenopathy. Cardiovascular: Normal rate, regular rhythm. Grossly normal heart sounds.  Good peripheral circulation. Respiratory: Normal respiratory effort.  No retractions. Lungs CTAB. Musculoskeletal: there is upper and lower extremities without any difficulty. Neurologic:  Normal speech and language. No gross focal neurologic deficits are appreciated.  Skin:  Skin is warm, dry and intact. No rash noted. ___________________________________________   LABS (all labs ordered are listed, but only abnormal  results are displayed)  Labs Reviewed - No data to display ____________________________________________  PROCEDURES  Procedure(s) performed: None  Procedures  Critical Care performed: No  ____________________________________________   INITIAL IMPRESSION / ASSESSMENT AND PLAN / ED COURSE  As part of my medical decision making, I reviewed the following data within the electronic MEDICAL RECORD NUMBER Notes from prior ED visits and [place] Controlled Substance Database  Patient was given clindamycin 600 mg  IMin the department along with Norco one tablet by mouth. Patient was discharged with a prescription for clindamycin 300 mg 4 times a day anda prescription for Tylenol  #3 as needed for pain. Patient was given a list of all dental clinics in the area so that she may follow up with a dentist.   ____________________________________________   FINAL CLINICAL IMPRESSION(S) / ED DIAGNOSES  Final diagnoses:  Dental abscess     ED Discharge Orders        Ordered    acetaminophen-codeine (TYLENOL #3) 300-30 MG tablet  Every 6 hours PRN     03/18/17 1418    clindamycin (CLEOCIN) 150 MG capsule     03/18/17 1418       Note:  This document was prepared using Dragon voice recognition software and may include unintentional dictation errors.    Michelle Deleon, Michelle [place] Deleon, Michelle Deleon 03/18/17 1449    Governor RooksLord, Rebecca, MD 03/18/17 [id number]

## 2017-03-18 NOTE — ED Triage Notes (Signed)
Pt to ED c/o dental pain. Pt states that she thinks she has an abscessed tooth on the right side of her mouth. Pt states that she is unable to eat, drink, or talk. Pt currently speaking without difficulty in triage. Pt in NAD at this time.

## 2017-05-10 ENCOUNTER — Emergency Department
Admission: EM | Admit: 2017-05-10 | Discharge: 2017-05-10 | Disposition: A | Discharge status: Home / Self Care | Payer: Medicaid Other | Attending: Emergency Medicine | Admitting: Emergency Medicine

## 2017-05-10 ENCOUNTER — Other Ambulatory Visit: Payer: Self-pay

## 2017-05-10 ENCOUNTER — Telehealth: Payer: Self-pay | Admitting: Emergency Medicine

## 2017-05-10 ENCOUNTER — Encounter: Payer: Self-pay | Admitting: Emergency Medicine

## 2017-05-10 DIAGNOSIS — M549 Dorsalgia, unspecified: Secondary | ICD-10-CM | POA: Diagnosis not present

## 2017-05-10 DIAGNOSIS — Z5321 Procedure and treatment not carried out due to patient leaving prior to being seen by health care provider: Secondary | ICD-10-CM | POA: Insufficient documentation

## 2017-05-10 DIAGNOSIS — R1030 Lower abdominal pain, unspecified: Secondary | ICD-10-CM | POA: Insufficient documentation

## 2017-05-10 LAB — URINALYSIS, COMPLETE (UACMP) WITH MICROSCOPIC
BILIRUBIN URINE: NEGATIVE
Bacteria, UA: NONE SEEN
Glucose, UA: NEGATIVE mg/dL
Hgb urine dipstick: NEGATIVE
KETONES UR: NEGATIVE mg/dL
LEUKOCYTES UA: NEGATIVE
Nitrite: NEGATIVE
PH: 6 (ref 5.0–8.0)
Protein, ur: NEGATIVE mg/dL
Specific Gravity, Urine: 1.016 (ref 1.005–1.030)

## 2017-05-10 LAB — CBC
HEMATOCRIT: 39 % (ref 35.0–47.0)
HEMOGLOBIN: 13 g/dL (ref 12.0–16.0)
MCH: 28.9 pg (ref 26.0–34.0)
MCHC: 33.4 g/dL (ref 32.0–36.0)
MCV: 86.4 fL (ref 80.0–100.0)
PLATELETS: 274 10*3/uL (ref 150–440)
RBC: 4.52 MIL/uL (ref 3.80–5.20)
RDW: 12.9 % (ref 11.5–14.5)
WBC: 6.1 10*3/uL (ref 3.6–11.0)

## 2017-05-10 LAB — POCT PREGNANCY, URINE: PREG TEST UR: NEGATIVE

## 2017-05-10 LAB — COMPREHENSIVE METABOLIC PANEL
ALT: 20 U/L (ref 14–54)
ANION GAP: 7 (ref 5–15)
AST: 18 U/L (ref 15–41)
Albumin: 4 g/dL (ref 3.5–5.0)
Alkaline Phosphatase: 63 U/L (ref 38–126)
BUN: 7 mg/dL (ref 6–20)
CHLORIDE: 106 mmol/L (ref 101–111)
CO2: 24 mmol/L (ref 22–32)
CREATININE: 0.65 mg/dL (ref 0.44–1.00)
Calcium: 8.9 mg/dL (ref 8.9–10.3)
Glucose, Bld: 129 mg/dL — ABNORMAL HIGH (ref 65–99)
POTASSIUM: 3.4 mmol/L — AB (ref 3.5–5.1)
SODIUM: 137 mmol/L (ref 135–145)
Total Bilirubin: 1.5 mg/dL — ABNORMAL HIGH (ref 0.3–1.2)
Total Protein: 6.9 g/dL (ref 6.5–8.1)

## 2017-05-10 LAB — PREGNANCY, URINE: Preg Test, Ur: POSITIVE — AB

## 2017-05-10 LAB — LIPASE, BLOOD: LIPASE: 22 U/L (ref 11–51)

## 2017-05-10 NOTE — ED Notes (Signed)
Patient called for re-evaluation and vital signs.  No answer at this time.

## 2017-05-10 NOTE — Telephone Encounter (Signed)
Called patient due to lwot to inquire about condition and follow up plans. The first number will not take calls and the second is disconnected.

## 2017-05-10 NOTE — ED Triage Notes (Signed)
Low abd and back pain for 3 days.   Feels like she is going to get her period.  Says it was due 12/26

## 2017-05-10 NOTE — ED Notes (Signed)
Patient called x2 for re-evaluation and vital signs.  Patient not answering at this time.

## 2017-11-28 ENCOUNTER — Other Ambulatory Visit: Payer: Self-pay | Admitting: Family Medicine

## 2017-11-28 DIAGNOSIS — O24419 Gestational diabetes mellitus in pregnancy, unspecified control: Secondary | ICD-10-CM

## 2017-12-04 ENCOUNTER — Ambulatory Visit: Payer: Medicaid Other

## 2017-12-07 ENCOUNTER — Ambulatory Visit
Admission: RE | Admit: 2017-12-07 | Discharge: 2017-12-07 | Disposition: A | Discharge status: Home / Self Care | Payer: Medicaid Other | Source: Ambulatory Visit | Attending: Family Medicine | Admitting: Family Medicine

## 2017-12-07 DIAGNOSIS — O24419 Gestational diabetes mellitus in pregnancy, unspecified control: Secondary | ICD-10-CM | POA: Diagnosis present

## 2017-12-07 DIAGNOSIS — Z3A33 33 weeks gestation of pregnancy: Secondary | ICD-10-CM | POA: Insufficient documentation

## 2020-04-06 ENCOUNTER — Emergency Department
Admission: EM | Admit: 2020-04-06 | Discharge: 2020-04-06 | Disposition: A | Discharge status: Home / Self Care | Payer: Medicaid Other | Attending: Emergency Medicine | Admitting: Emergency Medicine

## 2020-04-06 ENCOUNTER — Emergency Department: Payer: Medicaid Other

## 2020-04-06 DIAGNOSIS — F17210 Nicotine dependence, cigarettes, uncomplicated: Secondary | ICD-10-CM | POA: Insufficient documentation

## 2020-04-06 DIAGNOSIS — K921 Melena: Secondary | ICD-10-CM | POA: Insufficient documentation

## 2020-04-06 DIAGNOSIS — K2970 Gastritis, unspecified, without bleeding: Secondary | ICD-10-CM

## 2020-04-06 DIAGNOSIS — K2971 Gastritis, unspecified, with bleeding: Secondary | ICD-10-CM | POA: Diagnosis not present

## 2020-04-06 DIAGNOSIS — R1012 Left upper quadrant pain: Secondary | ICD-10-CM | POA: Diagnosis present

## 2020-04-06 DIAGNOSIS — R109 Unspecified abdominal pain: Secondary | ICD-10-CM

## 2020-04-06 LAB — CBC
HCT: 42.5 % (ref 36.0–46.0)
Hemoglobin: 14 g/dL (ref 12.0–15.0)
MCH: 28.7 pg (ref 26.0–34.0)
MCHC: 32.9 g/dL (ref 30.0–36.0)
MCV: 87.3 fL (ref 80.0–100.0)
Platelets: 267 10*3/uL (ref 150–400)
RBC: 4.87 MIL/uL (ref 3.87–5.11)
RDW: 12.8 % (ref 11.5–15.5)
WBC: 11.7 10*3/uL — ABNORMAL HIGH (ref 4.0–10.5)
nRBC: 0 % (ref 0.0–0.2)

## 2020-04-06 LAB — URINALYSIS, COMPLETE (UACMP) WITH MICROSCOPIC
Bacteria, UA: NONE SEEN
Bilirubin Urine: NEGATIVE
Glucose, UA: NEGATIVE mg/dL
Hgb urine dipstick: NEGATIVE
Ketones, ur: NEGATIVE mg/dL
Nitrite: NEGATIVE
Protein, ur: NEGATIVE mg/dL
Specific Gravity, Urine: 1.026 (ref 1.005–1.030)
pH: 6 (ref 5.0–8.0)

## 2020-04-06 LAB — LACTIC ACID, PLASMA
Lactic Acid, Venous: 1.1 mmol/L (ref 0.5–1.9)
Lactic Acid, Venous: 1 mmol/L (ref 0.5–1.9)

## 2020-04-06 LAB — COMPREHENSIVE METABOLIC PANEL
ALT: 16 U/L (ref 0–44)
AST: 18 U/L (ref 15–41)
Albumin: 4.3 g/dL (ref 3.5–5.0)
Alkaline Phosphatase: 53 U/L (ref 38–126)
Anion gap: 10 (ref 5–15)
BUN: 8 mg/dL (ref 6–20)
CO2: 21 mmol/L — ABNORMAL LOW (ref 22–32)
Calcium: 9.2 mg/dL (ref 8.9–10.3)
Chloride: 106 mmol/L (ref 98–111)
Creatinine, Ser: 0.67 mg/dL (ref 0.44–1.00)
GFR, Estimated: >60 mL/min (ref >60)
Glucose, Bld: 103 mg/dL — ABNORMAL HIGH (ref 70–99)
Potassium: 3.6 mmol/L (ref 3.5–5.1)
Sodium: 137 mmol/L (ref 135–145)
Total Bilirubin: 1 mg/dL (ref 0.3–1.2)
Total Protein: 7.4 g/dL (ref 6.5–8.1)

## 2020-04-06 LAB — PREGNANCY, URINE: Preg Test, Ur: NEGATIVE

## 2020-04-06 LAB — LIPASE, BLOOD: Lipase: 19 U/L (ref 11–51)

## 2020-04-06 LAB — TROPONIN I (HIGH SENSITIVITY)
Troponin I (High Sensitivity): 3 ng/L (ref <18)
Troponin I (High Sensitivity): 4 ng/L (ref <18)

## 2020-04-06 MED ORDER — LIDOCAINE VISCOUS HCL 2 % MT SOLN: 15.0000 mL | OROMUCOSAL | 0 refills | Status: AC | PRN

## 2020-04-06 MED ORDER — SUCRALFATE 1 G PO TABS: 1.0000 g | ORAL_TABLET | Freq: Four times a day (QID) | ORAL | 0 refills | Status: AC

## 2020-04-06 MED ORDER — IOHEXOL 300 MG/ML  SOLN
100.0000 mL | Freq: Once | INTRAMUSCULAR | Status: AC | PRN
  Administered 2020-04-06: 100 mL via INTRAVENOUS

## 2020-04-06 MED ORDER — HALOPERIDOL LACTATE 5 MG/ML IJ SOLN
5.0000 mg | Freq: Once | INTRAMUSCULAR | Status: AC
  Administered 2020-04-06: 5 mg via INTRAVENOUS
  Filled 2020-04-06: qty 1

## 2020-04-06 MED ORDER — LIDOCAINE VISCOUS HCL 2 % MT SOLN
15.0000 mL | Freq: Once | OROMUCOSAL | Status: AC
  Administered 2020-04-06: 15 mL via ORAL
  Filled 2020-04-06: qty 15

## 2020-04-06 MED ORDER — ALUM & MAG HYDROXIDE-SIMETH 200-200-20 MG/5ML PO SUSP
30.0000 mL | Freq: Once | ORAL | Status: AC
  Administered 2020-04-06: 30 mL via ORAL
  Filled 2020-04-06: qty 30

## 2020-04-06 MED ORDER — FAMOTIDINE 20 MG PO TABS: 20.0000 mg | ORAL_TABLET | Freq: Every day | ORAL | 1 refills | Status: AC

## 2020-04-06 MED ORDER — SODIUM CHLORIDE 0.9 % IV BOLUS
1000.0000 mL | Freq: Once | INTRAVENOUS | Status: AC
  Administered 2020-04-06: 1000 mL via INTRAVENOUS

## 2020-04-06 NOTE — ED Provider Notes (Signed)
[facility] Emergency Department Provider Note  ____________________________________________   I have reviewed the triage vital signs and the nursing notes.   HISTORY  Chief Complaint Abdominal Pain   History limited by: Not Limited   HPI Michelle Deleon is a 29 y.o. female who presents to the emergency department today because of concern for abdominal pain. The pain started three weeks ago. The patient states that the pain started after eating chinese food. The pain is located primarily in the left upper quadrant. She however has also had pain in her lower stomach. It has been accompanied by vomiting. The patient has also noticed some bloody stool. States that she was diagnosed with ulcers as a younger child but is no longer on any medication to help with acid. She has felt like she has had fevers.   Records reviewed. Per medical record review patient has a history of kidney stone.   History reviewed. No pertinent past medical history.  There are no problems to display for this patient.   History reviewed. No pertinent surgical history.  Prior to Admission medications   Medication Sig Start Date End Date Taking? Authorizing Provider  acetaminophen-codeine (TYLENOL #3) 300-30 MG tablet Take 1 tablet every 6 (six) hours as needed by mouth for moderate pain. 03/18/17   Tommi Rumps, PA-C  clindamycin (CLEOCIN) 150 MG capsule Take 2 capsule every 6 hours starting tonight 03/18/17   Tommi Rumps, PA-C    Allergies Chocolate flavor and Amoxicillin  No family history on file.  Social History Social History   Tobacco Use  . Smoking status: Current Some Day Smoker    Packs/day: 1.00    Types: Cigarettes  . Smokeless tobacco: Never Used  Substance Use Topics  . Alcohol use: No  . Drug use: Yes    Types: Marijuana    Comment: last night    Review of Systems Constitutional: No fever/chills Eyes: No visual changes. ENT: No sore  throat. Cardiovascular: Denies chest pain. Respiratory: Denies shortness of breath. Gastrointestinal: Positive for abdominal pain, nausea, vomiting and bloody stool. Genitourinary: Negative for dysuria. Musculoskeletal: Negative for back pain. Skin: Negative for rash. Neurological: Negative for headaches, focal weakness or numbness.  ____________________________________________   PHYSICAL EXAM:  VITAL SIGNS: ED Triage Vitals  Enc Vitals Group     BP 04/06/20 1639 126/65     Pulse Rate 04/06/20 1605 (!) 145     Resp 04/06/20 1605 (!) 25     Temp 04/06/20 1605 100 F (37.8 C)     Temp Source 04/06/20 1605 Oral     SpO2 04/06/20 1605 98 %     Weight 04/06/20 1601 215 lb (97.5 kg)     Height 04/06/20 1601 5\' 6"  (1.676 m)     Head Circumference --      Peak Flow --      Pain Score 04/06/20 1601 10    Constitutional: Alert and oriented.  Eyes: Conjunctivae are normal.  ENT      Head: Normocephalic and atraumatic.      Nose: No congestion/rhinnorhea.      Mouth/Throat: Mucous membranes are moist.      Neck: No stridor. Hematological/Lymphatic/Immunilogical: No cervical lymphadenopathy. Cardiovascular: Normal rate, regular rhythm.  No murmurs, rubs, or gallops.  Respiratory: Normal respiratory effort without tachypnea nor retractions. Breath sounds are clear and equal bilaterally. No wheezes/rales/rhonchi. Gastrointestinal: Soft and diffusely tender to palpation.  Genitourinary: Deferred Musculoskeletal: Normal range of motion in all extremities. No  lower extremity edema. Neurologic:  Normal speech and language. No gross focal neurologic deficits are appreciated.  Skin:  Skin is warm, dry and intact. No rash noted. Psychiatric: Mood and affect are normal. Speech and behavior are normal. Patient exhibits appropriate insight and judgment.  ____________________________________________    LABS (pertinent positives/negatives)  UA hazy, trace leukocytes, 0-5 rbc and wbc,  squamous epi 6-10 Lipase 19 Lactic acid 1.1 CBC wbc 11.7, hgb 14.0, plt 267 CMP wnl except co2 21, glu 103 ____________________________________________   EKG  I, Phineas Semen, attending physician, personally viewed and interpreted this EKG  EKG Time: 1607 Rate: 110 Rhythm: sinus tachycardia Axis: normal Intervals: qtc 427 QRS: narrow ST changes: no st elevation Impression: abnormal ekg  ____________________________________________    RADIOLOGY  CT ab/pel Small amount of fluid in pelvis. No other acute abnormality  ____________________________________________   PROCEDURES  Procedures  ____________________________________________   INITIAL IMPRESSION / ASSESSMENT AND PLAN / ED COURSE  Pertinent labs & imaging results that were available during my care of the patient were reviewed by me and considered in my medical decision making (see chart for details).   Patient presented to the emergency department today because of concerns for abdominal pain.  The abdominal pain has been present for the past 3 weeks.  Patient described as being severe and on exam patient had some generalized abdominal tenderness.  Work-up without concerning leukocytosis and patient is afebrile however given severity of the patient's pain did obtain a CT scan.  This showed some free fluid in the pelvis did not show any other acute abnormality.  I do think the free fluid in the pelvis is likely physiologic.  Patient did feel better after IV Haldol.  At this point I do think likely ulcer.  Discussed this with the patient.  Will plan on discharging with antiacids, sucralfate.  Did consider possible PE given tachycardia although patient without any hypoxia or shortness of breath.  No lower extremity pain.  I do think PE would be unlikely explanation for the patient's symptoms.  ____________________________________________   FINAL CLINICAL IMPRESSION(S) / ED DIAGNOSES  Final diagnoses:  Abdominal  pain, unspecified abdominal location  Gastritis, presence of bleeding unspecified, unspecified chronicity, unspecified gastritis type     Note: This dictation was prepared with Dragon dictation. Any transcriptional errors that result from this process are unintentional     Phineas Semen, MD 04/06/20 1943

## 2020-04-06 NOTE — ED Notes (Signed)
EKG performed with HR at 110.

## 2020-04-06 NOTE — Discharge Instructions (Signed)
Please seek medical attention for any high fevers, chest pain, shortness of breath, change in behavior, persistent vomiting, bloody stool or any other new or concerning symptoms.  

## 2020-04-06 NOTE — ED Triage Notes (Addendum)
Pt comes via POV from home with c/o abdominal pain for 3 weeks. Pt states severe pain and nausea. Pt tearful in triage.  Pt states she ate [place] and this all started after that.   Pt states left upper pain. Pt states some CP and SOB too.

## 2020-04-10 ENCOUNTER — Emergency Department
Admission: EM | Admit: 2020-04-10 | Discharge: 2020-04-10 | Disposition: A | Discharge status: Home / Self Care | Payer: Medicaid Other | Attending: Emergency Medicine | Admitting: Emergency Medicine

## 2020-04-10 ENCOUNTER — Encounter: Payer: Self-pay | Admitting: Emergency Medicine

## 2020-04-10 DIAGNOSIS — J029 Acute pharyngitis, unspecified: Secondary | ICD-10-CM | POA: Insufficient documentation

## 2020-04-10 DIAGNOSIS — Z5321 Procedure and treatment not carried out due to patient leaving prior to being seen by health care provider: Secondary | ICD-10-CM | POA: Diagnosis not present

## 2020-04-10 LAB — CULTURE, BLOOD (ROUTINE X 2): Culture: NO GROWTH

## 2020-04-10 NOTE — ED Notes (Signed)
Pt called for protocols and no answer x 3 .

## 2020-04-10 NOTE — ED Triage Notes (Signed)
Pt arrived via EMS from home with c/o sore throat x1 week.

## 2020-04-11 LAB — CULTURE, BLOOD (ROUTINE X 2): Special Requests: ADEQUATE

## 2020-08-03 ENCOUNTER — Other Ambulatory Visit: Payer: Self-pay

## 2020-08-03 ENCOUNTER — Emergency Department
Admission: EM | Admit: 2020-08-03 | Discharge: 2020-08-03 | Discharge status: Against Medical Advice | Payer: Medicaid Other | Attending: Emergency Medicine | Admitting: Emergency Medicine

## 2020-08-03 ENCOUNTER — Encounter: Payer: Self-pay | Admitting: Emergency Medicine

## 2020-08-03 DIAGNOSIS — R1084 Generalized abdominal pain: Secondary | ICD-10-CM | POA: Insufficient documentation

## 2020-08-03 DIAGNOSIS — F17210 Nicotine dependence, cigarettes, uncomplicated: Secondary | ICD-10-CM | POA: Insufficient documentation

## 2020-08-03 DIAGNOSIS — R112 Nausea with vomiting, unspecified: Secondary | ICD-10-CM

## 2020-08-03 DIAGNOSIS — R197 Diarrhea, unspecified: Secondary | ICD-10-CM | POA: Insufficient documentation

## 2020-08-03 LAB — CBC
HCT: 40.2 % (ref 36.0–46.0)
Hemoglobin: 12.9 g/dL (ref 12.0–15.0)
MCH: 27.9 pg (ref 26.0–34.0)
MCHC: 32.1 g/dL (ref 30.0–36.0)
MCV: 87 fL (ref 80.0–100.0)
Platelets: 301 10*3/uL (ref 150–400)
RBC: 4.62 MIL/uL (ref 3.87–5.11)
RDW: 13.5 % (ref 11.5–15.5)
WBC: 7.2 10*3/uL (ref 4.0–10.5)
nRBC: 0 % (ref 0.0–0.2)

## 2020-08-03 LAB — COMPREHENSIVE METABOLIC PANEL
ALT: 11 U/L (ref 0–44)
AST: 15 U/L (ref 15–41)
Albumin: 4.3 g/dL (ref 3.5–5.0)
Alkaline Phosphatase: 55 U/L (ref 38–126)
Anion gap: 8 (ref 5–15)
BUN: 9 mg/dL (ref 6–20)
CO2: 21 mmol/L — ABNORMAL LOW (ref 22–32)
Calcium: 9.3 mg/dL (ref 8.9–10.3)
Chloride: 111 mmol/L (ref 98–111)
Creatinine, Ser: 0.72 mg/dL (ref 0.44–1.00)
GFR, Estimated: >60 mL/min (ref >60)
Glucose, Bld: 117 mg/dL — ABNORMAL HIGH (ref 70–99)
Potassium: 3.7 mmol/L (ref 3.5–5.1)
Sodium: 140 mmol/L (ref 135–145)
Total Bilirubin: 0.7 mg/dL (ref 0.3–1.2)
Total Protein: 7.5 g/dL (ref 6.5–8.1)

## 2020-08-03 LAB — LIPASE, BLOOD: Lipase: 27 U/L (ref 11–51)

## 2020-08-03 MED ORDER — DROPERIDOL 2.5 MG/ML IJ SOLN: 2.5000 mg | Freq: Once | INTRAMUSCULAR | Status: DC

## 2020-08-03 MED ORDER — FENTANYL CITRATE (PF) 100 MCG/2ML IJ SOLN
50.0000 ug | Freq: Once | INTRAMUSCULAR | Status: AC
  Administered 2020-08-03: 50 ug via INTRAVENOUS
  Filled 2020-08-03: qty 2

## 2020-08-03 MED ORDER — METOCLOPRAMIDE HCL 5 MG/ML IJ SOLN
10.0000 mg | Freq: Once | INTRAMUSCULAR | Status: AC
  Administered 2020-08-03: 10 mg via INTRAVENOUS
  Filled 2020-08-03: qty 2

## 2020-08-03 MED ORDER — SODIUM CHLORIDE 0.9 % IV BOLUS
1000.0000 mL | Freq: Once | INTRAVENOUS | Status: AC
  Administered 2020-08-03: 1000 mL via INTRAVENOUS

## 2020-08-03 MED ORDER — ONDANSETRON 4 MG PO TBDP
4.0000 mg | ORAL_TABLET | Freq: Once | ORAL | Status: AC | PRN
  Administered 2020-08-03: 4 mg via ORAL
  Filled 2020-08-03: qty 1

## 2020-08-03 NOTE — ED Notes (Signed)
Pt presents to ED with c/o of vomiting and generalized ABD pain that started this morning. This RN walked into RM with pt sticking her finger down her throat screaming "I need something to eat". Pt states also diarrhea. Pt denies fevers or chills. Pt denies urinary symptoms.

## 2020-08-03 NOTE — ED Notes (Addendum)
This RN walked into room to give medications and pt was vomiting on the floor when she had been given 3 different emesis bags and educated on the use of the emesis bags. Pt was asked why she is vomiting on the floor and pt became irate stating "because I don't fucking feel good and this isn't a joke you bitch". Pt was told she will not speak to this RN that way and then pt continued to stil become irate and stated she was leaving and going to [place], pt still had IV in at this time when trying to leave pt was told she cannot leave with the IV in, pt continued to still storm out of the room and was stopped in the hallway by the EDP and was told she needed to have her IV taken out or get back to her room, pt then became tearful and continued to insult this RN. This RN handed off to Tammy as new RN, Agricultural consultant and EDP aware.

## 2020-08-03 NOTE — ED Notes (Signed)
Pt calls out by screaming stating IV was "ripped out," EDP aware and states no need for new IV at this time.

## 2020-08-03 NOTE — ED Triage Notes (Signed)
Pt to ED via POV, states started vomiting this morning, states has been throwing up yellow and brown emesis. Pt also states she feels like she is going to pass out. Pt c/o upper and lower abd pain.   On arrival to ED, pt heard loudly wretching and vomiting, stating she felt like she was going to pass out and crying out loudly. In triage pt continues to wretch repeatedly. Pt also visualized sticking her finger down her throat to induce vomiting.   Pt also crying and asking for water, then crying and repeatedly asking for help. VSS in triage.

## 2020-08-03 NOTE — ED Provider Notes (Signed)
[facility] Emergency Department Provider Note  Time seen: 8:43 AM  I have reviewed the triage vital signs and the nursing notes.   HISTORY  Chief Complaint Emesis and Abdominal Pain   HPI Michelle Deleon is a 30 y.o. female with no significant past medical history who presents to the emergency department for nausea vomiting.  According to the patient since early this morning she has been nauseated with frequent episodes of vomiting.  States a couple episodes of diarrhea.  Denies any fever.  States diffuse abdominal discomfort moderate pain.  Denies dysuria.  Denies fever.  Upon arrival patient actively retching, at times sticking her finger in her throat to retch.  Patient states she thinks she needs to vomit but then is also asking for something to eat.   History reviewed. No pertinent past medical history.  There are no problems to display for this patient.   History reviewed. No pertinent surgical history.  Prior to Admission medications   Medication Sig Start Date End Date Taking? Authorizing Provider  acetaminophen-codeine (TYLENOL #3) 300-30 MG tablet Take 1 tablet every 6 (six) hours as needed by mouth for moderate pain. 03/18/17   Tommi Rumps, PA-C  clindamycin (CLEOCIN) 150 MG capsule Take 2 capsule every 6 hours starting tonight 03/18/17   Tommi Rumps, PA-C  famotidine (PEPCID) 20 MG tablet Take 1 tablet (20 mg total) by mouth daily. 04/06/20 04/06/21  Phineas Semen, MD  lidocaine (XYLOCAINE) 2 % solution Use as directed 15 mLs in the mouth or throat as needed for mouth pain. 04/06/20   Phineas Semen, MD  sucralfate (CARAFATE) 1 g tablet Take 1 tablet (1 g total) by mouth 4 (four) times daily. 04/06/20   Phineas Semen, MD    Allergies  Allergen Reactions  . Chocolate Flavor Hives  . Amoxicillin Rash    History reviewed. No pertinent family history.  Social History Social History   Tobacco Use  . Smoking status:  Current Some Day Smoker    Packs/day: 1.00    Types: Cigarettes  . Smokeless tobacco: Never Used  Substance Use Topics  . Alcohol use: No  . Drug use: Yes    Types: Marijuana    Comment: last night    Review of Systems Constitutional: Negative for fever. Cardiovascular: Negative for chest pain. Respiratory: Negative for shortness of breath. Gastrointestinal: Positive for diffuse abdominal discomfort.  Positive for nausea vomiting.  Does state diarrhea this morning. Genitourinary: Negative for urinary compaints Musculoskeletal: Negative for musculoskeletal complaints Neurological: Negative for headache All other ROS negative  ____________________________________________   PHYSICAL EXAM:  VITAL SIGNS: ED Triage Vitals  Enc Vitals Group     BP 08/03/20 0814 110/77     Pulse Rate 08/03/20 0814 62     Resp 08/03/20 0814 18     Temp 08/03/20 0814 97.6 F (36.4 C)     Temp Source 08/03/20 0814 Oral     SpO2 08/03/20 0814 100 %     Weight 08/03/20 0814 206 lb (93.4 kg)     Height 08/03/20 0814 5\' 6"  (1.676 m)     Head Circumference --      Peak Flow --      Pain Score 08/03/20 0818 10     Pain Loc --      Pain Edu? --      Excl. in GC? --    Constitutional: Alert and oriented.  Actively retching. Eyes: Normal exam ENT  Head: Normocephalic and atraumatic.      Mouth/Throat: Mucous membranes are moist. Cardiovascular: Normal rate, regular rhythm.  Respiratory: Normal respiratory effort without tachypnea nor retractions. Breath sounds are clear  Gastrointestinal: Soft, no distention.  Patient states abdominal discomfort however has no reaction to diffuse abdominal palpation. Musculoskeletal: Nontender with normal range of motion in all extremities.  Neurologic:  Normal speech and language. No gross focal neurologic deficits Skin:  Skin is warm, dry and intact.  Psychiatric: Mood and affect are normal.   ____________________________________________    EKG  EKG  viewed and interpreted by myself shows a normal sinus rhythm at 60 bpm with a narrow QRS, normal axis, normal intervals, no concerning ST changes.  ____________________________________________  INITIAL IMPRESSION / ASSESSMENT AND PLAN / ED COURSE  Pertinent labs & imaging results that were available during my care of the patient were reviewed by me and considered in my medical decision making (see chart for details).   Patient presents to the emergency department for nausea vomiting since this morning.  Patient actively retching in the emergency department.  However is also asking for food.  We will treat with Reglan, IV fluids.  Patient states this is happened multiple times in the past where she will awake with nausea vomiting.  We will check labs and continue to closely monitor.  Patient had a CT scan approximately 3 months ago, I would prefer to hold off on further CT imaging if possible as the patient has a benign abdominal exam.  Lab work is pending.  Patient had pulled her IV out, likely accidentally.  Patient still says she is nauseated we will dose 2.5 mg IM droperidol.  Patient has eloped from the emergency department.  Did not have an IV in place.  Michelle Deleon was evaluated in Emergency Department on 08/03/2020 for the symptoms described in the history of present illness. She was evaluated in the context of the global COVID-19 pandemic, which necessitated consideration that the patient might be at risk for infection with the SARS-CoV-2 virus that causes COVID-19. Institutional protocols and algorithms that pertain to the evaluation of patients at risk for COVID-19 are in a state of rapid change based on information released by regulatory bodies including the CDC and federal and state organizations. These policies and algorithms were followed during the patient's care in the ED.  ____________________________________________   FINAL CLINICAL IMPRESSION(S) / ED DIAGNOSES  Nausea  vomiting Abdominal pain   Minna Antis, MD 08/03/20 1521

## 2020-08-03 NOTE — ED Notes (Signed)
Pt found not to be in her room at this time, MD aware

## 2020-09-23 ENCOUNTER — Emergency Department
Admission: EM | Admit: 2020-09-23 | Discharge: 2020-09-24 | Disposition: A | Discharge status: Home / Self Care | Payer: Medicaid Other | Attending: Emergency Medicine | Admitting: Emergency Medicine

## 2020-09-23 ENCOUNTER — Other Ambulatory Visit: Payer: Self-pay

## 2020-09-23 DIAGNOSIS — F32A Depression, unspecified: Secondary | ICD-10-CM | POA: Diagnosis present

## 2020-09-23 DIAGNOSIS — Z20822 Contact with and (suspected) exposure to covid-19: Secondary | ICD-10-CM | POA: Insufficient documentation

## 2020-09-23 DIAGNOSIS — R45851 Suicidal ideations: Secondary | ICD-10-CM | POA: Insufficient documentation

## 2020-09-23 DIAGNOSIS — F17210 Nicotine dependence, cigarettes, uncomplicated: Secondary | ICD-10-CM | POA: Diagnosis not present

## 2020-09-23 DIAGNOSIS — F322 Major depressive disorder, single episode, severe without psychotic features: Secondary | ICD-10-CM | POA: Insufficient documentation

## 2020-09-23 DIAGNOSIS — Y906 Blood alcohol level of 120-199 mg/100 ml: Secondary | ICD-10-CM | POA: Insufficient documentation

## 2020-09-23 LAB — ETHANOL: Alcohol, Ethyl (B): 129 mg/dL — ABNORMAL HIGH (ref <10)

## 2020-09-23 LAB — URINE DRUG SCREEN, QUALITATIVE (ARMC ONLY)
Amphetamines, Ur Screen: NOT DETECTED
Barbiturates, Ur Screen: NOT DETECTED
Benzodiazepine, Ur Scrn: NOT DETECTED
Cannabinoid 50 Ng, Ur ~~LOC~~: POSITIVE — AB
Cocaine Metabolite,Ur ~~LOC~~: NOT DETECTED
MDMA (Ecstasy)Ur Screen: NOT DETECTED
Methadone Scn, Ur: NOT DETECTED
Opiate, Ur Screen: NOT DETECTED
Phencyclidine (PCP) Ur S: NOT DETECTED
Tricyclic, Ur Screen: NOT DETECTED

## 2020-09-23 LAB — COMPREHENSIVE METABOLIC PANEL
ALT: 16 U/L (ref 0–44)
AST: 18 U/L (ref 15–41)
Albumin: 3.6 g/dL (ref 3.5–5.0)
Alkaline Phosphatase: 51 U/L (ref 38–126)
Anion gap: 11 (ref 5–15)
BUN: 7 mg/dL (ref 6–20)
CO2: 19 mmol/L — ABNORMAL LOW (ref 22–32)
Calcium: 8.5 mg/dL — ABNORMAL LOW (ref 8.9–10.3)
Chloride: 110 mmol/L (ref 98–111)
Creatinine, Ser: 0.57 mg/dL (ref 0.44–1.00)
GFR, Estimated: >60 mL/min (ref >60)
Glucose, Bld: 88 mg/dL (ref 70–99)
Potassium: 3.4 mmol/L — ABNORMAL LOW (ref 3.5–5.1)
Sodium: 140 mmol/L (ref 135–145)
Total Bilirubin: 0.7 mg/dL (ref 0.3–1.2)
Total Protein: 6 g/dL — ABNORMAL LOW (ref 6.5–8.1)

## 2020-09-23 LAB — CBC
HCT: 42.8 % (ref 36.0–46.0)
Hemoglobin: 14 g/dL (ref 12.0–15.0)
MCH: 29.4 pg (ref 26.0–34.0)
MCHC: 32.7 g/dL (ref 30.0–36.0)
MCV: 89.9 fL (ref 80.0–100.0)
Platelets: 292 10*3/uL (ref 150–400)
RBC: 4.76 MIL/uL (ref 3.87–5.11)
RDW: 14.5 % (ref 11.5–15.5)
WBC: 7.8 10*3/uL (ref 4.0–10.5)
nRBC: 0 % (ref 0.0–0.2)

## 2020-09-23 LAB — URINALYSIS, COMPLETE (UACMP) WITH MICROSCOPIC
Bacteria, UA: NONE SEEN
Bilirubin Urine: NEGATIVE
Glucose, UA: NEGATIVE mg/dL
Ketones, ur: NEGATIVE mg/dL
Leukocytes,Ua: NEGATIVE
Nitrite: NEGATIVE
Protein, ur: NEGATIVE mg/dL
Specific Gravity, Urine: 1.005 (ref 1.005–1.030)
Squamous Epithelial / HPF: NONE SEEN (ref 0–5)
pH: 5 (ref 5.0–8.0)

## 2020-09-23 LAB — POC URINE PREG, ED: Preg Test, Ur: NEGATIVE

## 2020-09-23 NOTE — ED Notes (Signed)
ED Provider at bedside. 

## 2020-09-23 NOTE — ED Notes (Signed)
Pt resting quietly at this time, pt given warm blanket, pt here with reports of depression and SI, no plan at this time. Advised patient on plan of care. Pt verbalized understanding.

## 2020-09-23 NOTE — ED Notes (Signed)
Pt ambulatory to bathroom at this time.

## 2020-09-23 NOTE — ED Triage Notes (Signed)
Pt here for suicidal ideation states has been feeling depressed. Supposed to take zoloft but has not taken it.

## 2020-09-23 NOTE — ED Provider Notes (Addendum)
[facility] Emergency Department Provider Note  Time seen: 11:58 PM  I have reviewed the triage vital signs and the nursing notes.   HISTORY  Chief Complaint Depression  HPI Michelle Deleon is a 30 y.o. female who presents to the emergency department for depression.  Patient states she has been experiencing worsening depression.  States she is supposed be on Zoloft but has not been taking it.  To myself patient denies any suicidal ideation.  Does admit to alcohol use tonight but denies any other substances.  Patient denies any medical complaints besides some chest heaviness at times per patient.  No past medical history on file.  There are no problems to display for this patient.   No past surgical history on file.  Prior to Admission medications   Medication Sig Start Date End Date Taking? Authorizing Provider  acetaminophen-codeine (TYLENOL #3) 300-30 MG tablet Take 1 tablet every 6 (six) hours as needed by mouth for moderate pain. 03/18/17   Tommi Rumps, PA-C  clindamycin (CLEOCIN) 150 MG capsule Take 2 capsule every 6 hours starting tonight 03/18/17   Tommi Rumps, PA-C  famotidine (PEPCID) 20 MG tablet Take 1 tablet (20 mg total) by mouth daily. 04/06/20 04/06/21  Phineas Semen, MD  lidocaine (XYLOCAINE) 2 % solution Use as directed 15 mLs in the mouth or throat as needed for mouth pain. 04/06/20   Phineas Semen, MD  sucralfate (CARAFATE) 1 g tablet Take 1 tablet (1 g total) by mouth 4 (four) times daily. 04/06/20   Phineas Semen, MD    Allergies  Allergen Reactions  . Chocolate Flavor Hives  . Amoxicillin Rash    No family history on file.  Social History Social History   Tobacco Use  . Smoking status: Current Some Day Smoker    Packs/day: 1.00    Types: Cigarettes  . Smokeless tobacco: Never Used  Substance Use Topics  . Alcohol use: No  . Drug use: Yes    Types: Marijuana    Comment: last night    Review of  Systems Constitutional: Negative for fever. Cardiovascular: Mild chest discomfort at times, longstanding Respiratory: Negative for shortness of breath. Gastrointestinal: Negative for abdominal pain Musculoskeletal: Negative for musculoskeletal complaints Neurological: Negative for headache All other ROS negative  ____________________________________________   PHYSICAL EXAM:  VITAL SIGNS: ED Triage Vitals  Enc Vitals Group     BP 09/23/20 2118 115/79     Pulse Rate 09/23/20 2118 88     Resp 09/23/20 2118 20     Temp 09/23/20 2118 98.5 F (36.9 C)     Temp Source 09/23/20 2118 Oral     SpO2 09/23/20 2118 100 %     Weight 09/23/20 2115 180 lb (81.6 kg)     Height 09/23/20 2115 5\' 6"  (1.676 m)     Head Circumference --      Peak Flow --      Pain Score 09/23/20 2115 0     Pain Loc --      Pain Edu? --      Excl. in GC? --    Constitutional: Alert and oriented. Well appearing and in no distress. Eyes: Normal exam ENT      Head: Normocephalic and atraumatic.      Mouth/Throat: Mucous membranes are moist. Cardiovascular: Normal rate, regular rhythm.  Respiratory: Normal respiratory effort without tachypnea nor retractions. Breath sounds are clear Gastrointestinal: Soft and nontender. No distention.  Musculoskeletal: Nontender with normal range  of motion in all extremities.  Neurologic:  Normal speech and language. No gross focal neurologic deficits Skin:  Skin is warm, dry and intact.  Psychiatric: Flat affect  ____________________________________________  EKG viewed and interpreted by myself shows normal sinus rhythm at 70 bpm with a narrow QRS, normal axis, normal normals, no concerning ST changes.  INITIAL IMPRESSION / ASSESSMENT AND PLAN / ED COURSE  Pertinent labs & imaging results that were available during my care of the patient were reviewed by me and considered in my medical decision making (see chart for details).   Patient presents to the emergency department  for worsening depression and stress.  Overall patient appears well does admit to alcohol use, ethanol level of 129 on lab work.  Remainder the lab work is largely nonrevealing.  Patient denies any suicidal elation to myself does not appear to meet IVC criteria at this time and is willing to stay voluntarily to talk to psychiatry.  We will have psychiatry evaluate.  Patient agreeable to plan of care.  Patient no longer wishes to wait for psychiatry.  Patient's work-up is essentially been normal besides elevated alcohol level upon arrival but that was over 10 hours ago.  No SI or HI.  Will provide outpatient resources for the patient.  Michelle Deleon was evaluated in Emergency Department on 09/23/2020 for the symptoms described in the history of present illness. She was evaluated in the context of the global COVID-19 pandemic, which necessitated consideration that the patient might be at risk for infection with the SARS-CoV-2 virus that causes COVID-19. Institutional protocols and algorithms that pertain to the evaluation of patients at risk for COVID-19 are in a state of rapid change based on information released by regulatory bodies including the CDC and federal and state organizations. These policies and algorithms were followed during the patient's care in the ED.  The patient has been placed in psychiatric observation due to the need to provide a safe environment for the patient while obtaining psychiatric consultation and evaluation, as well as ongoing medical and medication management to treat the patient's condition.  The patient has not been placed under full IVC at this time.  ____________________________________________   FINAL CLINICAL IMPRESSION(S) / ED DIAGNOSES  Depression   Minna Antis, MD 09/24/20 0002    Minna Antis, MD 09/24/20 [id number]    Minna Antis, MD 09/24/20 [phone]

## 2020-09-24 LAB — RESP PANEL BY RT-PCR (FLU A&B, COVID) ARPGX2
Influenza A by PCR: NEGATIVE
Influenza B by PCR: NEGATIVE
SARS Coronavirus 2 by RT PCR: NEGATIVE

## 2020-09-24 NOTE — ED Notes (Signed)
Pt making comments about wanting to leave. States she has been waiting to see psychiatry. Pt states she wants to go home because she is hungry and wants [place]. Dr. Lenard Lance notified and talked with patient.

## 2020-09-24 NOTE — ED Notes (Signed)
DC instructions discussed with patient denies any SI/HI, encouraged to follow up with [facility] for further outpatient assistance, pt verbalized understanding.

## 2020-09-24 NOTE — ED Notes (Signed)
Pt given cup of gingerale °

## 2020-09-24 NOTE — Discharge Instructions (Addendum)
You have been seen in the emergency department for a  psychiatric concern. You have been evaluated both medically as well as psychiatrically. Please follow-up with your outpatient resources provided. Return to the emergency department for any worsening symptoms, or any thoughts of hurting yourself or anyone else so that we may attempt to help you. 
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
## 2020-09-25 ENCOUNTER — Other Ambulatory Visit: Payer: Self-pay

## 2020-09-25 DIAGNOSIS — M25512 Pain in left shoulder: Secondary | ICD-10-CM | POA: Insufficient documentation

## 2020-09-25 DIAGNOSIS — R202 Paresthesia of skin: Secondary | ICD-10-CM | POA: Diagnosis not present

## 2020-09-25 DIAGNOSIS — Z5321 Procedure and treatment not carried out due to patient leaving prior to being seen by health care provider: Secondary | ICD-10-CM | POA: Insufficient documentation

## 2020-09-25 DIAGNOSIS — R109 Unspecified abdominal pain: Secondary | ICD-10-CM | POA: Insufficient documentation

## 2020-09-25 LAB — CBC
HCT: 44.4 % (ref 36.0–46.0)
Hemoglobin: 14.4 g/dL (ref 12.0–15.0)
MCH: 29 pg (ref 26.0–34.0)
MCHC: 32.4 g/dL (ref 30.0–36.0)
MCV: 89.3 fL (ref 80.0–100.0)
Platelets: 275 10*3/uL (ref 150–400)
RBC: 4.97 MIL/uL (ref 3.87–5.11)
RDW: 14.2 % (ref 11.5–15.5)
WBC: 8.6 10*3/uL (ref 4.0–10.5)
nRBC: 0 % (ref 0.0–0.2)

## 2020-09-25 LAB — COMPREHENSIVE METABOLIC PANEL
ALT: 15 U/L (ref 0–44)
AST: 16 U/L (ref 15–41)
Albumin: 3.7 g/dL (ref 3.5–5.0)
Alkaline Phosphatase: 49 U/L (ref 38–126)
Anion gap: 8 (ref 5–15)
BUN: 10 mg/dL (ref 6–20)
CO2: 23 mmol/L (ref 22–32)
Calcium: 8.7 mg/dL — ABNORMAL LOW (ref 8.9–10.3)
Chloride: 110 mmol/L (ref 98–111)
Creatinine, Ser: 0.76 mg/dL (ref 0.44–1.00)
GFR, Estimated: >60 mL/min (ref >60)
Glucose, Bld: 122 mg/dL — ABNORMAL HIGH (ref 70–99)
Potassium: 3.6 mmol/L (ref 3.5–5.1)
Sodium: 141 mmol/L (ref 135–145)
Total Bilirubin: 1.1 mg/dL (ref 0.3–1.2)
Total Protein: 6.1 g/dL — ABNORMAL LOW (ref 6.5–8.1)

## 2020-09-25 LAB — LIPASE, BLOOD: Lipase: 23 U/L (ref 11–51)

## 2020-09-25 NOTE — ED Triage Notes (Signed)
Pt states that she started having sharp pain in the left side of her abdomen- pt states it comes and goes- pt states she also has pain in her left shoulder that radiates down her arm and into her neck- pt states that sometimes it makes her hand tingly- pt states that she also has tingling in her feet when she sits on the floor with her kids

## 2020-09-26 ENCOUNTER — Emergency Department
Admission: EM | Admit: 2020-09-26 | Discharge: 2020-09-26 | Disposition: A | Discharge status: Home / Self Care | Payer: Medicaid Other | Attending: Emergency Medicine | Admitting: Emergency Medicine

## 2020-09-26 NOTE — ED Notes (Signed)
Pt called several times for room, another patient in the lobby who is familiar with her said she left about 40 mins ago.

## 2020-09-28 ENCOUNTER — Emergency Department (HOSPITAL_COMMUNITY)
Admission: EM | Admit: 2020-09-28 | Discharge: 2020-09-28 | Disposition: A | Discharge status: Home / Self Care | Payer: Medicaid Other

## 2020-09-28 NOTE — ED Notes (Addendum)
Pt. Called x 3 with no response for vital signs. Nurse aware.

## 2020-09-28 NOTE — ED Notes (Signed)
Pt called for triage and no response 

## 2020-12-09 ENCOUNTER — Ambulatory Visit: Payer: Self-pay | Admitting: Advanced Practice Midwife

## 2021-02-04 ENCOUNTER — Other Ambulatory Visit: Payer: Self-pay

## 2021-02-04 ENCOUNTER — Emergency Department
Admission: EM | Admit: 2021-02-04 | Discharge: 2021-02-05 | Disposition: A | Discharge status: Home / Self Care | Payer: Medicaid Other | Attending: Emergency Medicine | Admitting: Emergency Medicine

## 2021-02-04 ENCOUNTER — Emergency Department: Payer: Medicaid Other

## 2021-02-04 DIAGNOSIS — R1030 Lower abdominal pain, unspecified: Secondary | ICD-10-CM | POA: Insufficient documentation

## 2021-02-04 DIAGNOSIS — F17210 Nicotine dependence, cigarettes, uncomplicated: Secondary | ICD-10-CM | POA: Diagnosis not present

## 2021-02-04 DIAGNOSIS — N939 Abnormal uterine and vaginal bleeding, unspecified: Secondary | ICD-10-CM | POA: Diagnosis not present

## 2021-02-04 DIAGNOSIS — R102 Pelvic and perineal pain: Secondary | ICD-10-CM | POA: Insufficient documentation

## 2021-02-04 DIAGNOSIS — B9689 Other specified bacterial agents as the cause of diseases classified elsewhere: Secondary | ICD-10-CM

## 2021-02-04 DIAGNOSIS — R112 Nausea with vomiting, unspecified: Secondary | ICD-10-CM | POA: Insufficient documentation

## 2021-02-04 LAB — COMPREHENSIVE METABOLIC PANEL
ALT: 21 U/L (ref 0–44)
AST: 25 U/L (ref 15–41)
Albumin: 3.9 g/dL (ref 3.5–5.0)
Alkaline Phosphatase: 40 U/L (ref 38–126)
Anion gap: 10 (ref 5–15)
BUN: 9 mg/dL (ref 6–20)
CO2: 24 mmol/L (ref 22–32)
Calcium: 9 mg/dL (ref 8.9–10.3)
Chloride: 105 mmol/L (ref 98–111)
Creatinine, Ser: 0.63 mg/dL (ref 0.44–1.00)
GFR, Estimated: >60 mL/min (ref >60)
Glucose, Bld: 103 mg/dL — ABNORMAL HIGH (ref 70–99)
Potassium: 3.5 mmol/L (ref 3.5–5.1)
Sodium: 139 mmol/L (ref 135–145)
Total Bilirubin: 1.2 mg/dL (ref 0.3–1.2)
Total Protein: 6.7 g/dL (ref 6.5–8.1)

## 2021-02-04 LAB — CBC WITH DIFFERENTIAL/PLATELET
Abs Immature Granulocytes: 0.02 10*3/uL (ref 0.00–0.07)
Basophils Absolute: 0.1 10*3/uL (ref 0.0–0.1)
Basophils Relative: 1 %
Eosinophils Absolute: 0 10*3/uL (ref 0.0–0.5)
Eosinophils Relative: 1 %
HCT: 41.9 % (ref 36.0–46.0)
Hemoglobin: 13.7 g/dL (ref 12.0–15.0)
Immature Granulocytes: 0 %
Lymphocytes Relative: 30 %
Lymphs Abs: 2.3 10*3/uL (ref 0.7–4.0)
MCH: 29.4 pg (ref 26.0–34.0)
MCHC: 32.7 g/dL (ref 30.0–36.0)
MCV: 89.9 fL (ref 80.0–100.0)
Monocytes Absolute: 0.6 10*3/uL (ref 0.1–1.0)
Monocytes Relative: 8 %
Neutro Abs: 4.7 10*3/uL (ref 1.7–7.7)
Neutrophils Relative %: 60 %
Platelets: 351 10*3/uL (ref 150–400)
RBC: 4.66 MIL/uL (ref 3.87–5.11)
RDW: 12.4 % (ref 11.5–15.5)
WBC: 7.6 10*3/uL (ref 4.0–10.5)
nRBC: 0 % (ref 0.0–0.2)

## 2021-02-04 LAB — WET PREP, GENITAL
Sperm: NONE SEEN
Trich, Wet Prep: NONE SEEN
Yeast Wet Prep HPF POC: NONE SEEN

## 2021-02-04 LAB — URINALYSIS, ROUTINE W REFLEX MICROSCOPIC
Bilirubin Urine: NEGATIVE
Glucose, UA: NEGATIVE mg/dL
Ketones, ur: 5 mg/dL — AB
Leukocytes,Ua: NEGATIVE
Nitrite: NEGATIVE
Protein, ur: NEGATIVE mg/dL
Specific Gravity, Urine: 1.017 (ref 1.005–1.030)
pH: 7 (ref 5.0–8.0)

## 2021-02-04 LAB — PREGNANCY, URINE: Preg Test, Ur: NEGATIVE

## 2021-02-04 LAB — LIPASE, BLOOD: Lipase: 23 U/L (ref 11–51)

## 2021-02-04 MED ORDER — METRONIDAZOLE 500 MG PO TABS: 500.0000 mg | ORAL_TABLET | Freq: Two times a day (BID) | ORAL | 0 refills | Status: AC

## 2021-02-04 NOTE — Discharge Instructions (Addendum)
Take antibiotic as prescribed.  Return to the ER for worsening symptoms, persistent vomiting, difficulty breathing or other concerns. 

## 2021-02-04 NOTE — ED Provider Notes (Signed)
[facility] Emergency Department Provider Note   ____________________________________________   Event Date/Time   First MD Initiated Contact with Patient 02/04/21 2142     (approximate)  I have reviewed the triage vital signs and the nursing notes.   HISTORY  Chief Complaint Abdominal Pain    HPI Michelle Deleon is a 30 y.o. female with no significant past medical history presents to the ED complaining of pelvic and abdominal pain.  Patient reports she has been dealing with about 2 days of sharp pain in her pelvic area as well as her lower abdomen.  Pain has been present constantly and is not exacerbated or alleviated by anything.  It is been associated with vaginal bleeding, which patient states is unusual for her because she already had her menstrual cycle this month.  She has not noticed any dysuria or vaginal discharge.  She has been feeling nauseous with occasional vomiting, denies any changes in bowel movements.  She denies any fevers or flank pain, has not had any prior surgeries on her abdomen.        No past medical history on file.  There are no problems to display for this patient.   No past surgical history on file.  Prior to Admission medications   Medication Sig Start Date End Date Taking? Authorizing Provider  acetaminophen-codeine (TYLENOL #3) 300-30 MG tablet Take 1 tablet every 6 (six) hours as needed by mouth for moderate pain. 03/18/17   Tommi Rumps, PA-C  clindamycin (CLEOCIN) 150 MG capsule Take 2 capsule every 6 hours starting tonight 03/18/17   Tommi Rumps, PA-C  famotidine (PEPCID) 20 MG tablet Take 1 tablet (20 mg total) by mouth daily. 04/06/20 04/06/21  [facility], MD  lidocaine (XYLOCAINE) 2 % solution Use as directed 15 mLs in the mouth or throat as needed for mouth pain. 04/06/20   [facility], MD  sucralfate (CARAFATE) 1 g tablet Take 1 tablet (1 g total) by mouth 4 (four) times daily. 04/06/20    [facility], MD    Allergies Chocolate flavor and Amoxicillin  No family history on file.  Social History Social History   Tobacco Use   Smoking status: Some Days    Packs/day: 1.00    Types: Cigarettes   Smokeless tobacco: Never  Substance Use Topics   Alcohol use: No   Drug use: Yes    Types: Marijuana    Comment: last night    Review of Systems  Constitutional: No fever/chills Eyes: No visual changes. ENT: No sore throat. Cardiovascular: Denies chest pain. Respiratory: Denies shortness of breath. Gastrointestinal: Positive for abdominal pain.  Positive for nausea and vomiting.  No diarrhea.  No constipation. Genitourinary: Negative for dysuria.  Positive for pelvic pain and vaginal bleeding. Musculoskeletal: Negative for back pain. Skin: Negative for rash. Neurological: Negative for headaches, focal weakness or numbness.  ____________________________________________   PHYSICAL EXAM:  VITAL SIGNS: ED Triage Vitals  Enc Vitals Group     BP 02/04/21 1610 (!) 147/94     Pulse Rate 02/04/21 1610 72     Resp 02/04/21 1610 18     Temp 02/04/21 1610 98.5 F (36.9 C)     Temp Source 02/04/21 1610 Oral     SpO2 02/04/21 1610 97 %     Weight 02/04/21 1611 185 lb (83.9 kg)     Height 02/04/21 1611 5\' 6"  (1.676 m)     Head Circumference --      Peak  Flow --      Pain Score --      Pain Loc --      Pain Edu? --      Excl. in GC? --     Constitutional: Alert and oriented. Eyes: Conjunctivae are normal. Head: Atraumatic. Nose: No congestion/rhinnorhea. Mouth/Throat: Mucous membranes are moist. Neck: Normal ROM Cardiovascular: Normal rate, regular rhythm. Grossly normal heart sounds. Respiratory: Normal respiratory effort.  No retractions. Lungs CTAB. Gastrointestinal: Soft and nontender.  No CVA tenderness bilaterally.  No distention. Genitourinary: Moderate vaginal bleeding, no discharge noted.  No cervical motion or adnexal tenderness  noted. Musculoskeletal: No lower extremity tenderness nor edema. Neurologic:  Normal speech and language. No gross focal neurologic deficits are appreciated. Skin:  Skin is warm, dry and intact. No rash noted. Psychiatric: Mood and affect are normal. Speech and behavior are normal.  ____________________________________________   LABS (all labs ordered are listed, but only abnormal results are displayed)  Labs Reviewed  WET PREP, GENITAL - Abnormal; Notable for the following components:      Result Value   Clue Cells Wet Prep HPF POC PRESENT (*)    WBC, Wet Prep HPF POC FEW (*)    All other components within normal limits  COMPREHENSIVE METABOLIC PANEL - Abnormal; Notable for the following components:   Glucose, Bld 103 (*)    All other components within normal limits  URINALYSIS, ROUTINE W REFLEX MICROSCOPIC - Abnormal; Notable for the following components:   Color, Urine YELLOW (*)    APPearance HAZY (*)    Hgb urine dipstick MODERATE (*)    Ketones, ur 5 (*)    All other components within normal limits  CHLAMYDIA/NGC RT PCR ([facility] ONLY)            CBC WITH DIFFERENTIAL/PLATELET  LIPASE, BLOOD  PREGNANCY, URINE  POC OCCULT BLOOD, ED    PROCEDURES  Procedure(s) performed (including Critical Care):  Procedures   ____________________________________________   INITIAL IMPRESSION / ASSESSMENT AND PLAN / ED COURSE      30 year old female with no significant past medical history presents to the ED with 2 days of pelvic and lower abdominal pain associated with vaginal bleeding, nausea, and vomiting.  Patient has no abdominal tenderness on exam, pelvic exam shows bleeding but is otherwise unremarkable.  We will further assess for pelvic etiology of patient's symptoms with ultrasound.  UA shows no signs of infection and and based on patient's description of symptoms I doubt kidney stone or appendicitis.  Lab work is unremarkable, LFTs and lipase within normal limits.  Patient  declines pain medication at this time.  If ultrasound is unremarkable, patient would be appropriate for discharge home with OB/GYN follow-up.      ____________________________________________   FINAL CLINICAL IMPRESSION(S) / ED DIAGNOSES  Final diagnoses:  Pelvic pain  Vaginal bleeding     ED Discharge Orders     None        Note:  This document was prepared using Dragon voice recognition software and may include unintentional dictation errors.    Chesley Noon, MD 02/04/21 2235

## 2021-02-04 NOTE — ED Triage Notes (Signed)
Pt presents to ED with c/o of R sided flank pain that radiates to her groin area. Pt states HX of kidney stones. Pt states also some vaginal bleeding that is not related to her menstrual cycle. Pt also states N/V. Pt is tearful in triage. Pt also states increased urine frequency.

## 2021-02-04 NOTE — ED Provider Notes (Signed)
-----------------------------------------   11:54 PM on 02/04/2021 -----------------------------------------   Pelvic ultrasound interpreted per Dr. Manson Passey: 1. Age-appropriate pelvic ultrasound.  No acute process.  Updated patient on ultrasound results.  Strict return precautions given.  Patient verbalizes understanding and agrees with plan of care.   Irean Hong, MD 02/05/21 [phone]

## 2021-02-05 LAB — CHLAMYDIA/NGC RT PCR (ARMC ONLY)
Chlamydia Tr: NOT DETECTED
N gonorrhoeae: NOT DETECTED

## 2021-02-06 LAB — URINALYSIS, MICROSCOPIC (REFLEX): Bacteria, UA: NONE SEEN

## 2022-10-03 ENCOUNTER — Other Ambulatory Visit: Payer: Medicaid Other

## 2022-10-21 ENCOUNTER — Encounter: Payer: Self-pay | Admitting: Emergency Medicine

## 2022-10-21 ENCOUNTER — Emergency Department
Admission: EM | Admit: 2022-10-21 | Discharge: 2022-10-21 | Disposition: A | Discharge status: Home / Self Care | Payer: Medicaid Other | Attending: Emergency Medicine | Admitting: Emergency Medicine

## 2022-10-21 ENCOUNTER — Other Ambulatory Visit: Payer: Self-pay

## 2022-10-21 ENCOUNTER — Emergency Department: Payer: Medicaid Other

## 2022-10-21 DIAGNOSIS — Y990 Civilian activity done for income or pay: Secondary | ICD-10-CM | POA: Insufficient documentation

## 2022-10-21 DIAGNOSIS — S62306A Unspecified fracture of fifth metacarpal bone, right hand, initial encounter for closed fracture: Secondary | ICD-10-CM | POA: Insufficient documentation

## 2022-10-21 DIAGNOSIS — S62339A Displaced fracture of neck of unspecified metacarpal bone, initial encounter for closed fracture: Secondary | ICD-10-CM

## 2022-10-21 DIAGNOSIS — M7989 Other specified soft tissue disorders: Secondary | ICD-10-CM | POA: Diagnosis not present

## 2022-10-21 DIAGNOSIS — W208XXA Other cause of strike by thrown, projected or falling object, initial encounter: Secondary | ICD-10-CM | POA: Insufficient documentation

## 2022-10-21 DIAGNOSIS — S6991XA Unspecified injury of right wrist, hand and finger(s), initial encounter: Secondary | ICD-10-CM | POA: Diagnosis present

## 2022-10-21 MED ORDER — OXYCODONE HCL 5 MG PO TABS: 5.0000 mg | ORAL_TABLET | Freq: Three times a day (TID) | ORAL | 0 refills | Status: AC | PRN

## 2022-10-21 MED ORDER — MORPHINE SULFATE (PF) 4 MG/ML IV SOLN
4.0000 mg | Freq: Once | INTRAVENOUS | Status: AC
  Administered 2022-10-21: 4 mg via INTRAMUSCULAR
  Filled 2022-10-21: qty 1
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
## 2022-10-21 NOTE — Discharge Instructions (Addendum)
Please follow-up with orthopedic provided to you on Monday for further management of your hand fracture.  You are given a 5 day course of pain medication.  Please pick up at your pharmacy

## 2022-10-21 NOTE — ED Triage Notes (Signed)
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  Pt states a jar of pickles fell onto her R hand.  Swelling and bruising noted.    Ice pack given.

## 2022-10-22 ENCOUNTER — Encounter: Payer: Self-pay | Admitting: Emergency Medicine

## 2023-12-10 ENCOUNTER — Other Ambulatory Visit

## 2023-12-12 ENCOUNTER — Other Ambulatory Visit
